# Patient Record
Sex: Female | Born: 1935 | Race: White | Hispanic: No | Smoking: Former smoker
Health system: Southern US, Community
[De-identification: ages and names within clinical notes are randomized; demographics above are authoritative.]

## PROBLEM LIST (undated history)

## (undated) DIAGNOSIS — Z860100 Personal history of colon polyps, unspecified: Secondary | ICD-10-CM

## (undated) DIAGNOSIS — C55 Malignant neoplasm of uterus, part unspecified: Secondary | ICD-10-CM

## (undated) DIAGNOSIS — K222 Esophageal obstruction: Secondary | ICD-10-CM

## (undated) DIAGNOSIS — R350 Frequency of micturition: Secondary | ICD-10-CM

## (undated) DIAGNOSIS — G5601 Carpal tunnel syndrome, right upper limb: Secondary | ICD-10-CM

## (undated) DIAGNOSIS — R9431 Abnormal electrocardiogram [ECG] [EKG]: Secondary | ICD-10-CM

## (undated) DIAGNOSIS — K625 Hemorrhage of anus and rectum: Secondary | ICD-10-CM

## (undated) DIAGNOSIS — I2510 Atherosclerotic heart disease of native coronary artery without angina pectoris: Secondary | ICD-10-CM

## (undated) DIAGNOSIS — E785 Hyperlipidemia, unspecified: Secondary | ICD-10-CM

## (undated) DIAGNOSIS — G479 Sleep disorder, unspecified: Secondary | ICD-10-CM

## (undated) DIAGNOSIS — K219 Gastro-esophageal reflux disease without esophagitis: Secondary | ICD-10-CM

## (undated) DIAGNOSIS — M949 Disorder of cartilage, unspecified: Secondary | ICD-10-CM

## (undated) DIAGNOSIS — M899 Disorder of bone, unspecified: Secondary | ICD-10-CM

## (undated) DIAGNOSIS — M546 Pain in thoracic spine: Secondary | ICD-10-CM

## (undated) DIAGNOSIS — E538 Deficiency of other specified B group vitamins: Secondary | ICD-10-CM

## (undated) DIAGNOSIS — I10 Essential (primary) hypertension: Secondary | ICD-10-CM

## (undated) DIAGNOSIS — I999 Unspecified disorder of circulatory system: Secondary | ICD-10-CM

## (undated) HISTORY — DX: Esophageal obstruction: K22.2

## (undated) HISTORY — DX: Deficiency of other specified B group vitamins: E53.8

## (undated) HISTORY — DX: Personal history of colon polyps, unspecified: Z86.0100

## (undated) HISTORY — PX: ANKLE FRACTURE SURGERY: SHX122

## (undated) HISTORY — DX: Unspecified disorder of circulatory system: I99.9

## (undated) HISTORY — DX: Hemorrhage of anus and rectum: K62.5

## (undated) HISTORY — DX: Abnormal electrocardiogram (ECG) (EKG): R94.31

## (undated) HISTORY — DX: Disorder of cartilage, unspecified: M94.9

## (undated) HISTORY — PX: BREAST EXCISIONAL BIOPSY: SUR124

## (undated) HISTORY — DX: Pain in thoracic spine: M54.6

## (undated) HISTORY — DX: Disorder of bone, unspecified: M89.9

## (undated) HISTORY — DX: Gastro-esophageal reflux disease without esophagitis: K21.9

## (undated) HISTORY — DX: Hyperlipidemia, unspecified: E78.5

## (undated) HISTORY — PX: WRIST FRACTURE SURGERY: SHX121

## (undated) HISTORY — DX: Atherosclerotic heart disease of native coronary artery without angina pectoris: I25.10

## (undated) HISTORY — DX: Carpal tunnel syndrome, right upper limb: G56.01

## (undated) HISTORY — DX: Essential (primary) hypertension: I10

## (undated) HISTORY — DX: Malignant neoplasm of uterus, part unspecified: C55

## (undated) HISTORY — DX: Sleep disorder, unspecified: G47.9

## (undated) HISTORY — PX: CHOLECYSTECTOMY: SHX55

## (undated) HISTORY — PX: COLONOSCOPY W/ BIOPSIES AND POLYPECTOMY: SHX1376

## (undated) HISTORY — DX: Frequency of micturition: R35.0

## (undated) HISTORY — DX: Personal history of colonic polyps: Z86.010

---

## 1999-02-26 ENCOUNTER — Inpatient Hospital Stay (HOSPITAL_COMMUNITY): Admission: EM | Admit: 1999-02-26 | Discharge: 1999-02-28 | Payer: Self-pay | Admitting: Emergency Medicine

## 1999-02-27 ENCOUNTER — Encounter: Payer: Self-pay | Admitting: Emergency Medicine

## 2000-02-13 ENCOUNTER — Other Ambulatory Visit: Admission: RE | Admit: 2000-02-13 | Discharge: 2000-02-13 | Payer: Self-pay | Admitting: Internal Medicine

## 2000-02-15 ENCOUNTER — Encounter: Payer: Self-pay | Admitting: Internal Medicine

## 2000-02-15 ENCOUNTER — Encounter: Admission: RE | Admit: 2000-02-15 | Discharge: 2000-02-15 | Payer: Self-pay | Admitting: Internal Medicine

## 2000-03-06 ENCOUNTER — Encounter: Admission: RE | Admit: 2000-03-06 | Discharge: 2000-03-06 | Payer: Self-pay | Admitting: Internal Medicine

## 2000-03-06 ENCOUNTER — Encounter: Payer: Self-pay | Admitting: Internal Medicine

## 2000-11-26 HISTORY — PX: ESOPHAGEAL DILATION: SHX303

## 2001-06-30 ENCOUNTER — Encounter (INDEPENDENT_AMBULATORY_CARE_PROVIDER_SITE_OTHER): Payer: Self-pay

## 2001-06-30 ENCOUNTER — Other Ambulatory Visit: Admission: RE | Admit: 2001-06-30 | Discharge: 2001-06-30 | Payer: Self-pay | Admitting: Gastroenterology

## 2002-04-29 ENCOUNTER — Encounter: Admission: RE | Admit: 2002-04-29 | Discharge: 2002-04-29 | Payer: Self-pay | Admitting: Internal Medicine

## 2002-04-29 ENCOUNTER — Encounter: Payer: Self-pay | Admitting: Internal Medicine

## 2002-06-12 ENCOUNTER — Encounter: Payer: Self-pay | Admitting: Internal Medicine

## 2002-06-12 ENCOUNTER — Ambulatory Visit (HOSPITAL_COMMUNITY): Admission: RE | Admit: 2002-06-12 | Discharge: 2002-06-12 | Payer: Self-pay | Admitting: Internal Medicine

## 2002-07-02 ENCOUNTER — Other Ambulatory Visit: Admission: RE | Admit: 2002-07-02 | Discharge: 2002-07-02 | Payer: Self-pay | Admitting: Obstetrics and Gynecology

## 2004-11-08 ENCOUNTER — Ambulatory Visit: Payer: Self-pay | Admitting: Internal Medicine

## 2004-11-26 HISTORY — PX: ESOPHAGEAL DILATION: SHX303

## 2004-12-05 ENCOUNTER — Ambulatory Visit: Payer: Self-pay | Admitting: Gastroenterology

## 2004-12-11 ENCOUNTER — Ambulatory Visit: Payer: Self-pay | Admitting: Gastroenterology

## 2004-12-18 ENCOUNTER — Ambulatory Visit: Payer: Self-pay | Admitting: Gastroenterology

## 2004-12-25 ENCOUNTER — Ambulatory Visit: Payer: Self-pay | Admitting: Gastroenterology

## 2005-01-03 ENCOUNTER — Ambulatory Visit: Payer: Self-pay | Admitting: Gastroenterology

## 2006-09-02 ENCOUNTER — Ambulatory Visit: Payer: Self-pay | Admitting: Internal Medicine

## 2006-09-02 ENCOUNTER — Encounter: Payer: Self-pay | Admitting: Cardiovascular Disease

## 2006-09-05 ENCOUNTER — Ambulatory Visit: Payer: Self-pay | Admitting: Internal Medicine

## 2006-09-05 LAB — CONVERTED CEMR LAB
AST: 15 units/L (ref 0–37)
Basophils Absolute: 0 10*3/uL (ref 0.0–0.1)
Eosinophil percent: 3.7 % (ref 0.0–5.0)
HCT: 43.9 % (ref 36.0–46.0)
Lymphocytes Relative: 33.9 % (ref 12.0–46.0)
Monocytes Absolute: 0.6 10*3/uL (ref 0.2–0.7)
Monocytes Relative: 8.3 % (ref 3.0–11.0)
Potassium: 4.6 meq/L (ref 3.5–5.1)
RBC: 4.73 M/uL (ref 3.87–5.11)
RDW: 13.5 % (ref 11.5–14.6)
TSH: 2.62 microintl units/mL (ref 0.35–5.50)
WBC: 6.7 10*3/uL (ref 4.5–10.5)

## 2006-09-20 ENCOUNTER — Ambulatory Visit: Payer: Self-pay | Admitting: Internal Medicine

## 2006-09-27 ENCOUNTER — Encounter: Admission: RE | Admit: 2006-09-27 | Discharge: 2006-09-27 | Payer: Self-pay | Admitting: Internal Medicine

## 2006-10-28 ENCOUNTER — Encounter: Payer: Self-pay | Admitting: Internal Medicine

## 2007-01-03 ENCOUNTER — Ambulatory Visit: Payer: Self-pay | Admitting: Internal Medicine

## 2007-01-03 LAB — CONVERTED CEMR LAB
Cholesterol: 158 mg/dL (ref 0–200)
LDL Cholesterol: 86 mg/dL (ref 0–99)
Triglycerides: 126 mg/dL (ref 0–149)

## 2007-01-08 ENCOUNTER — Ambulatory Visit: Payer: Self-pay | Admitting: Internal Medicine

## 2007-03-26 ENCOUNTER — Ambulatory Visit: Payer: Self-pay | Admitting: Internal Medicine

## 2007-03-27 DIAGNOSIS — E538 Deficiency of other specified B group vitamins: Secondary | ICD-10-CM | POA: Insufficient documentation

## 2007-03-28 ENCOUNTER — Ambulatory Visit: Payer: Self-pay | Admitting: Internal Medicine

## 2007-09-10 ENCOUNTER — Ambulatory Visit: Payer: Self-pay | Admitting: Internal Medicine

## 2007-09-10 LAB — CONVERTED CEMR LAB
BUN: 16 mg/dL (ref 6–23)
Cholesterol: 160 mg/dL (ref 0–200)
Creatinine, Ser: 0.9 mg/dL (ref 0.4–1.2)
HDL: 49.4 mg/dL (ref >39.0)
LDL Cholesterol: 76 mg/dL (ref 0–99)
Total CHOL/HDL Ratio: 3.2
Triglycerides: 174 mg/dL — ABNORMAL HIGH (ref 0–149)
VLDL: 35 mg/dL (ref 0–40)

## 2007-09-18 ENCOUNTER — Ambulatory Visit: Payer: Self-pay | Admitting: Internal Medicine

## 2007-09-18 DIAGNOSIS — Z8679 Personal history of other diseases of the circulatory system: Secondary | ICD-10-CM | POA: Insufficient documentation

## 2007-09-18 LAB — CONVERTED CEMR LAB: Cholesterol, target level: 200 mg/dL

## 2007-09-29 ENCOUNTER — Encounter: Admission: RE | Admit: 2007-09-29 | Discharge: 2007-09-29 | Payer: Self-pay | Admitting: Internal Medicine

## 2007-09-30 ENCOUNTER — Encounter (INDEPENDENT_AMBULATORY_CARE_PROVIDER_SITE_OTHER): Payer: Self-pay | Admitting: *Deleted

## 2007-09-30 ENCOUNTER — Telehealth (INDEPENDENT_AMBULATORY_CARE_PROVIDER_SITE_OTHER): Payer: Self-pay | Admitting: *Deleted

## 2007-10-03 ENCOUNTER — Encounter (INDEPENDENT_AMBULATORY_CARE_PROVIDER_SITE_OTHER): Payer: Self-pay | Admitting: *Deleted

## 2007-10-03 ENCOUNTER — Ambulatory Visit: Payer: Self-pay | Admitting: Internal Medicine

## 2007-10-08 ENCOUNTER — Encounter: Admission: RE | Admit: 2007-10-08 | Discharge: 2007-10-08 | Payer: Self-pay | Admitting: Internal Medicine

## 2007-10-08 ENCOUNTER — Encounter: Payer: Self-pay | Admitting: Internal Medicine

## 2007-10-10 ENCOUNTER — Encounter (INDEPENDENT_AMBULATORY_CARE_PROVIDER_SITE_OTHER): Payer: Self-pay | Admitting: *Deleted

## 2007-10-30 ENCOUNTER — Ambulatory Visit: Payer: Self-pay | Admitting: Internal Medicine

## 2007-10-30 DIAGNOSIS — K222 Esophageal obstruction: Secondary | ICD-10-CM | POA: Insufficient documentation

## 2008-10-20 ENCOUNTER — Telehealth (INDEPENDENT_AMBULATORY_CARE_PROVIDER_SITE_OTHER): Payer: Self-pay | Admitting: *Deleted

## 2008-12-20 ENCOUNTER — Telehealth (INDEPENDENT_AMBULATORY_CARE_PROVIDER_SITE_OTHER): Payer: Self-pay | Admitting: *Deleted

## 2008-12-28 ENCOUNTER — Ambulatory Visit: Payer: Self-pay | Admitting: Internal Medicine

## 2008-12-30 ENCOUNTER — Ambulatory Visit: Payer: Self-pay | Admitting: Internal Medicine

## 2008-12-30 DIAGNOSIS — M899 Disorder of bone, unspecified: Secondary | ICD-10-CM | POA: Insufficient documentation

## 2008-12-30 DIAGNOSIS — K625 Hemorrhage of anus and rectum: Secondary | ICD-10-CM | POA: Insufficient documentation

## 2008-12-30 DIAGNOSIS — G479 Sleep disorder, unspecified: Secondary | ICD-10-CM | POA: Insufficient documentation

## 2008-12-30 DIAGNOSIS — M949 Disorder of cartilage, unspecified: Secondary | ICD-10-CM

## 2008-12-30 DIAGNOSIS — Z860100 Personal history of colon polyps, unspecified: Secondary | ICD-10-CM | POA: Insufficient documentation

## 2008-12-31 ENCOUNTER — Ambulatory Visit: Payer: Self-pay | Admitting: Internal Medicine

## 2008-12-31 ENCOUNTER — Encounter: Payer: Self-pay | Admitting: Cardiovascular Disease

## 2008-12-31 DIAGNOSIS — D510 Vitamin B12 deficiency anemia due to intrinsic factor deficiency: Secondary | ICD-10-CM | POA: Insufficient documentation

## 2008-12-31 DIAGNOSIS — T887XXA Unspecified adverse effect of drug or medicament, initial encounter: Secondary | ICD-10-CM | POA: Insufficient documentation

## 2009-01-03 ENCOUNTER — Encounter (INDEPENDENT_AMBULATORY_CARE_PROVIDER_SITE_OTHER): Payer: Self-pay | Admitting: *Deleted

## 2009-01-03 LAB — CONVERTED CEMR LAB
Albumin: 3.7 g/dL (ref 3.5–5.2)
Alkaline Phosphatase: 71 units/L (ref 39–117)
Basophils Relative: 0.7 % (ref 0.0–3.0)
Cholesterol: 161 mg/dL (ref 0–200)
LDL Cholesterol: 95 mg/dL (ref 0–99)
MCHC: 34.1 g/dL (ref 30.0–36.0)
MCV: 91.5 fL (ref 78.0–100.0)
Monocytes Absolute: 0.5 10*3/uL (ref 0.1–1.0)
Platelets: 204 10*3/uL (ref 150–400)
Potassium: 4 meq/L (ref 3.5–5.1)
RBC: 4.59 M/uL (ref 3.87–5.11)
TSH: 3.42 microintl units/mL (ref 0.35–5.50)
Total Bilirubin: 0.9 mg/dL (ref 0.3–1.2)
Total Protein: 6.9 g/dL (ref 6.0–8.3)
VLDL: 22 mg/dL (ref 0–40)

## 2009-01-07 ENCOUNTER — Ambulatory Visit: Payer: Self-pay | Admitting: Gastroenterology

## 2009-01-10 ENCOUNTER — Telehealth: Payer: Self-pay | Admitting: Gastroenterology

## 2009-01-12 ENCOUNTER — Ambulatory Visit: Payer: Self-pay | Admitting: Gastroenterology

## 2009-05-11 ENCOUNTER — Encounter: Admission: RE | Admit: 2009-05-11 | Discharge: 2009-05-11 | Payer: Self-pay | Admitting: Internal Medicine

## 2009-06-02 ENCOUNTER — Telehealth (INDEPENDENT_AMBULATORY_CARE_PROVIDER_SITE_OTHER): Payer: Self-pay | Admitting: *Deleted

## 2009-10-07 ENCOUNTER — Ambulatory Visit: Payer: Self-pay | Admitting: Internal Medicine

## 2009-10-10 ENCOUNTER — Emergency Department (HOSPITAL_COMMUNITY): Admission: EM | Admit: 2009-10-10 | Discharge: 2009-10-10 | Payer: Self-pay | Admitting: Emergency Medicine

## 2009-11-26 HISTORY — PX: TOTAL ABDOMINAL HYSTERECTOMY W/ BILATERAL SALPINGOOPHORECTOMY: SHX83

## 2009-12-28 LAB — CONVERTED CEMR LAB: Vitamin B-12: 309 pg/mL (ref 211–911)

## 2009-12-30 ENCOUNTER — Ambulatory Visit: Payer: Self-pay | Admitting: Internal Medicine

## 2009-12-30 DIAGNOSIS — K219 Gastro-esophageal reflux disease without esophagitis: Secondary | ICD-10-CM | POA: Insufficient documentation

## 2010-01-18 ENCOUNTER — Ambulatory Visit: Payer: Self-pay | Admitting: Internal Medicine

## 2010-01-18 DIAGNOSIS — G5600 Carpal tunnel syndrome, unspecified upper limb: Secondary | ICD-10-CM | POA: Insufficient documentation

## 2010-05-31 ENCOUNTER — Encounter: Admission: RE | Admit: 2010-05-31 | Discharge: 2010-05-31 | Payer: Self-pay | Admitting: Internal Medicine

## 2010-06-26 ENCOUNTER — Ambulatory Visit: Payer: Self-pay | Admitting: Internal Medicine

## 2010-06-26 DIAGNOSIS — C55 Malignant neoplasm of uterus, part unspecified: Secondary | ICD-10-CM | POA: Insufficient documentation

## 2010-06-30 ENCOUNTER — Encounter: Payer: Self-pay | Admitting: Internal Medicine

## 2010-07-05 ENCOUNTER — Encounter: Payer: Self-pay | Admitting: Internal Medicine

## 2010-07-18 ENCOUNTER — Encounter: Payer: Self-pay | Admitting: Internal Medicine

## 2010-08-23 ENCOUNTER — Encounter: Payer: Self-pay | Admitting: Internal Medicine

## 2010-09-14 ENCOUNTER — Encounter: Payer: Self-pay | Admitting: Internal Medicine

## 2010-09-22 ENCOUNTER — Encounter: Payer: Self-pay | Admitting: Internal Medicine

## 2010-11-03 ENCOUNTER — Ambulatory Visit: Payer: Self-pay | Admitting: Internal Medicine

## 2010-11-03 ENCOUNTER — Telehealth (INDEPENDENT_AMBULATORY_CARE_PROVIDER_SITE_OTHER): Payer: Self-pay | Admitting: *Deleted

## 2010-11-03 DIAGNOSIS — R350 Frequency of micturition: Secondary | ICD-10-CM | POA: Insufficient documentation

## 2010-11-03 DIAGNOSIS — M546 Pain in thoracic spine: Secondary | ICD-10-CM | POA: Insufficient documentation

## 2010-11-03 LAB — CONVERTED CEMR LAB
Bilirubin Urine: NEGATIVE
Nitrite: NEGATIVE
Urobilinogen, UA: NEGATIVE
pH: 5

## 2010-11-04 ENCOUNTER — Encounter: Payer: Self-pay | Admitting: Internal Medicine

## 2010-11-06 ENCOUNTER — Ambulatory Visit: Payer: Self-pay | Admitting: Internal Medicine

## 2010-11-07 ENCOUNTER — Encounter: Payer: Self-pay | Admitting: Internal Medicine

## 2010-12-26 NOTE — Letter (Signed)
Summary: [facility]   Imported By: Lanelle Bal 07/26/2010 12:03:12  _____________________________________________________________________  External Attachment:    Type:   Image     Comment:   External Document

## 2010-12-26 NOTE — Assessment & Plan Note (Signed)
Summary: coughing, wants her throat looked at - jr   Vital Signs:  Patient profile:   77 year old female Weight:      150.4 pounds Temp:     98.5 degrees F oral Pulse rate:   60 / minute Resp:     17 per minute BP sitting:   138 / 84  (left arm) Cuff size:   large  Vitals Entered By: Shonna Chock (December 30, 2009 11:01 AM) CC: Cough off/on since November 2010 Comments REVIEWED MED LIST, PATIENT AGREED DOSE AND INSTRUCTION CORRECT  Flu Vaccine Consent Questions     Do you have a history of severe allergic reactions to this vaccine? no    Any prior history of allergic reactions to egg and/or gelatin? no    Do you have a sensitivity to the preservative Thimersol? no    Do you have a past history of Guillan-Barre Syndrome? no    Do you currently have an acute febrile illness? no    Have you ever had a severe reaction to latex? no    Vaccine information given and explained to patient? yes    Are you currently pregnant? no    Lot Number:[id number]   Exp Date:05/25/2010   Site Given  Right Deltoid IM   Primary Care Provider:  Marga Melnick, MD  CC:  Cough off/on since November 2010.  History of Present Illness:  Cough      This is an 77 year old woman who presents with Cough intermittently since 09/2009.  She was seen in ER in 10/2009 for more severe cough with SOB.No PMH of asthma , HHN treatments helped. HHN @ home helps. The patient reports non-productive cough, but denies pleuritic chest pain, shortness of breath, wheezing, exertional dyspnea, fever, hemoptysis, and malaise.  The patient denies the following symptoms: cold/URI symptoms, sore throat, nasal congestion, chronic rhinitis, weight loss, acid reflux symptoms, and peripheral edema.  The cough is worse with cold exposure.  Risk factors include history of reflux.  Diagnostic testing to date has included CXR.  She is on ACE-I. She smoked  until 40 yrs ago. She is worried about throat cancer.   Allergies: 1)  !  Codeine  Past History:  Past Medical History: Hyperlipidemia, LDL goal = < 100 Hypertension Transient ischemic attack, hx of I995 Low B12 Osteopenia (T score -1.8 @ femoral neck in 2007) Cerebrovascular accident, hx of 1999 Transient ischemic attack, hx of 2002 Colonic polyps, hx of 2006, Dr Jarold Motto GERD  Review of Systems General:  Denies chills, fever, sweats, and weight loss. ENT:  Complains of hoarseness; denies difficulty swallowing, nasal congestion, sinus pressure, and sore throat. Resp:  Denies chest pain with inspiration, coughing up blood, sputum productive, and wheezing. GI:  Denies abdominal pain, bloody stools, dark tarry stools, and indigestion. Allergy:  Denies itching eyes and sneezing.  Physical Exam  General:  Appears younger than age,well-nourished,in no acute distress; alert,appropriate and cooperative throughout examination Ears:  External ear exam shows no significant lesions or deformities.  Otoscopic examination reveals clear canals, tympanic membranes are intact bilaterally without bulging, retraction, inflammation or discharge. Hearing is grossly normal bilaterally.Some wax bilaterally Nose:  External nasal examination shows no deformity or inflammation. Nasal mucosa are pink and moist without lesions or exudates. Mouth:  Oral mucosa and oropharynx without lesions or exudates.  Teeth in good repair. Slightly hoarse Lungs:  Normal respiratory effort, chest expands symmetrically. Lungs are clear to auscultation, no crackles or wheezes. Heart:  regular rhythm, no murmur, no gallop, no rub, no JVD, and bradycardia.   Abdomen:  Bowel sounds positive,abdomen soft and non-tender without masses, organomegaly or hernias noted. Skin:  Intact without suspicious lesions or rashes Cervical Nodes:  No lymphadenopathy noted Axillary Nodes:  No palpable lymphadenopathy   Impression & Recommendations:  Problem # 1:  COUGH (ICD-786.2) probably from ACE-I  Problem #  2:  HYPERTENSION, ESSENTIAL NOS (ICD-401.9)  The following medications were removed from the medication list:    Benazepril Hcl 40 Mg Tabs (Benazepril hcl) .Marland Kitchen... Take 1/2 tablet by mouth once daily Her updated medication list for this problem includes:    Amlodipine Besylate 5 Mg Tabs (Amlodipine besylate) .Marland Kitchen... 1 once daily  Problem # 3:  GERD (ICD-530.81) PMH of  Complete Medication List: 1)  Pravastatin Sodium 20 Mg Tabs (Pravastatin sodium) .Marland Kitchen.. 1 by mouth qd 2)  Plavix 75 Mg Tabs (Clopidogrel bisulfate) .Marland Kitchen.. 1 by mouth qd 3)  Multivitamin  .... Once daily 4)  Calcium 600 600 Mg Tabs (Calcium carbonate) .... Take 1 tablet by mouth two times a day 5)  Vitamin D3 400 Unit Tabs (Cholecalciferol) .... Take 2 tablets by mouth once daily 6)  Tylenol Extra Strength 500 Mg Tabs (Acetaminophen) .Marland Kitchen.. 1 by mouth at bedtime 7)  Biotin 1000 Mcg Tabs (Biotin) .Marland Kitchen.. 1 by mouth once daily 8)  Aspirin Adult Low Strength 81 Mg Tbec (Aspirin) .Marland Kitchen.. 1 by mouth once daily 9)  Stool Softner(next To Ashby Dawes)  .Marland Kitchen.. 1 by mouth once daily 10)  Amlodipine Besylate 5 Mg Tabs (Amlodipine besylate) .Marland Kitchen.. 1 once daily  Other Orders: Flu Vaccine 40yrs + (86578) Administration Flu vaccine - MCR (I6962)  Patient Instructions: 1)  Qvar 2 puffs every 12 hrs ; gargle & spit after use. Prilosec OTC 30 minutes before b'fast.  2)  Avoid foods high in acid (tomatoes, citrus juices, spicy foods). Avoid eating within two hours of lying down or before exercising. Do not over eat; try smaller more frequent meals. Elevate head of bed twelve inches when sleeping. 3)  Please schedule a follow-up appointment in 2& 1/2  weeks. Prescriptions: AMLODIPINE BESYLATE 5 MG TABS (AMLODIPINE BESYLATE) 1 once daily  #30 x 5   Entered and Authorized by:   Marga Melnick MD   Signed by:   Marga Melnick MD on 12/30/2009   Method used:   Faxed to ...       [facility]  [address] [phone]* (retail)       [address]       [place], [place]  [id number]       Ph: [phone] or [phone]       Fax: [phone]   RxID:   [id number]

## 2010-12-26 NOTE — Assessment & Plan Note (Signed)
Summary: 2 1/2 week roa//lh   Vital Signs:  Patient profile:   77 year old female Weight:      154.4 pounds Temp:     98.4 degrees F oral Pulse rate:   76 / minute Resp:     15 per minute BP sitting:   126 / 72  (left arm) Cuff size:   large  Vitals Entered By: Shonna Chock (January 18, 2010 10:33 AM) CC: Follow-up visit: patient is better since last OV, patient would like to make sure throat is ok, Hypertension Management Comments REVIEWED MED LIST, PATIENT AGREED DOSE AND INSTRUCTION CORRECT    Primary Care Provider:  Marga Melnick, MD  CC:  Follow-up visit: patient is better since last OV, patient would like to make sure throat is ok, and Hypertension Management.  History of Present Illness: She feels better on Amlodipine; NP cough is improved dramatically. Concerned about cough in context of remote smoking  Hypertension History:      She complains of neurologic problems, but denies headache, chest pain, palpitations, dyspnea with exertion, orthopnea, PND, peripheral edema, visual symptoms, syncope, and side effects from treatment.  She notes no problems with any antihypertensive medication side effects.  Further comments include: No BP checks @ home .        Positive major cardiovascular risk factors include female age 72 years old or older, hyperlipidemia, hypertension, and family history for ischemic heart disease (males less than 61 years old).  Negative major cardiovascular risk factors include no history of diabetes and non-tobacco-user status.        Positive history for target organ damage include prior stroke (or TIA).  Further assessment for target organ damage reveals no history of ASHD or peripheral vascular disease.     Allergies: 1)  ! Codeine  Review of Systems Eyes:  Denies blurring, double vision, and vision loss-both eyes. ENT:  Denies hoarseness, nasal congestion, postnasal drainage, and sinus pressure; No purulence. Rare dysphagia when hurrying   meals. Resp:  Denies shortness of breath, sputum productive, and wheezing. Neuro:  Complains of numbness and tingling; Positional N&T RUE @ night & driving.  Physical Exam  General:  in no acute distress; alert,appropriate and cooperative throughout examination Neck:  No deformities, masses, or tenderness noted. Lungs:  Normal respiratory effort, chest expands symmetrically. Lungs are clear to auscultation, no crackles or wheezes. Heart:  Normal rate and regular rhythm. S1 and S2 normal without gallop, murmur, click, rub. S4 Pulses:  R and L carotid,radial  pulses are full and equal bilaterally Extremities:  No clubbing, cyanosis, edema. Neurologic:  alert & oriented X3.   + Tinel's RUE Skin:  Intact without suspicious lesions or rashes Cervical Nodes:  No lymphadenopathy noted Axillary Nodes:  No palpable lymphadenopathy   Impression & Recommendations:  Problem # 1:  HYPERTENSION, ESSENTIAL NOS (ICD-401.9)  controlled Her updated medication list for this problem includes:    Amlodipine Besylate 5 Mg Tabs (Amlodipine besylate) .Marland Kitchen... 1 once daily  Orders: Prescription Created Electronically [phone])  Problem # 2:  COUGH (ICD-786.2)  improved off ACE-I  Orders: T-2 View CXR (71020TC)  Problem # 3:  CARPAL TUNNEL SYNDROME, RIGHT (ICD-354.0)  Orders: Venipuncture (60454) TLB-TSH (Thyroid Stimulating Hormone) (84443-TSH) TLB-T4 (Thyrox), Free [phone])  Complete Medication List: 1)  Pravastatin Sodium 20 Mg Tabs (Pravastatin sodium) .Marland Kitchen.. 1 by mouth qd 2)  Plavix 75 Mg Tabs (Clopidogrel bisulfate) .Marland Kitchen.. 1 by mouth qd 3)  Multivitamin  .... Once daily 4)  Calcium 600  600 Mg Tabs (Calcium carbonate) .... Take 1 tablet by mouth two times a day 5)  Vitamin D3 400 Unit Tabs (Cholecalciferol) .... Take 2 tablets by mouth once daily 6)  Tylenol Extra Strength 500 Mg Tabs (Acetaminophen) .Marland Kitchen.. 1 by mouth at bedtime 7)  Biotin 1000 Mcg Tabs (Biotin) .Marland Kitchen.. 1 by mouth once daily 8)   Aspirin Adult Low Strength 81 Mg Tbec (Aspirin) .Marland Kitchen.. 1 by mouth once daily 9)  Stool Softner(next To Ashby Dawes)  .Marland Kitchen.. 1 by mouth once daily 10)  Amlodipine Besylate 5 Mg Tabs (Amlodipine besylate) .Marland Kitchen.. 1 once daily  Hypertension Assessment/Plan:      The patient's hypertensive risk group is category C: Target organ damage and/or diabetes.  Her calculated 10 year risk of coronary heart disease is 9 %.  Today's blood pressure is 126/72.    Patient Instructions: 1)  Wrist Splint @ night  for Carpal Tunnel Syndrome if thyroid tests are normal. Prescriptions: AMLODIPINE BESYLATE 5 MG TABS (AMLODIPINE BESYLATE) 1 once daily  #90 x 3   Entered and Authorized by:   Marga Melnick MD   Signed by:   Marga Melnick MD on 01/18/2010   Method used:   [facility]ed to ...       [facility]  [address] [phone] (retail)       [address]       [place], [place]  [id number]       Ph: [phone] or [phone]       Fax: [phone]   RxID:   [id number]

## 2010-12-26 NOTE — Letter (Signed)
Summary: [facility]  [facility]   Imported By: Lanelle Bal 07/26/2010 12:49:24  _____________________________________________________________________  External Attachment:    Type:   Image     Comment:   External Document

## 2010-12-26 NOTE — Letter (Signed)
Summary: [facility]  [facility]   Imported By: Lanelle Bal 07/17/2010 09:38:02  _____________________________________________________________________  External Attachment:    Type:   Image     Comment:   External Document

## 2010-12-26 NOTE — Letter (Signed)
Summary: [facility]   Imported By: Lanelle Bal 09/13/2010 16:11:56  _____________________________________________________________________  External Attachment:    Type:   Image     Comment:   External Document

## 2010-12-26 NOTE — Letter (Signed)
Summary: [facility] Radiation [facility]  [facility]iation [facility]   Imported By: Lanelle Bal 10/10/2010 08:36:53  _____________________________________________________________________  External Attachment:    Type:   Image     Comment:   External Document

## 2010-12-26 NOTE — Letter (Signed)
Summary: [facility] Radiation Oncology  [facility]   Imported By: Lanelle Bal 10/10/2010 08:38:02  _____________________________________________________________________  External Attachment:    Type:   Image     Comment:   External Document

## 2010-12-26 NOTE — Assessment & Plan Note (Signed)
Summary: back problems/cystitis/kn   Vital Signs:  Patient profile:   77 year old female Height:      59.75 inches (151.77 cm) Weight:      149.25 pounds (67.84 kg) BMI:     29.50 Temp:     98.4 degrees F (36.89 degrees C) oral Resp:     16 per minute BP sitting:   120 / 80  (left arm) Cuff size:   large  Vitals Entered By: Lucious Groves CMA (November 03, 2010 3:47 PM) CC: C/O back problems./kb, Dysuria, Back pain Is Patient Diabetic? No Pain Assessment Patient in pain? yes     Location: back Intensity: 4 Type: dull ache Comments Patient notes that she has back problems and thinks she may have cystitis. She notes increased urination frequency and urgency. She denies fever, and dysuria.   Primary Care Provider:  Marga Melnick, MD  CC:  C/O back problems./kb, Dysuria, and Back pain.  History of Present Illness:      This is an 77 year old woman who presents with  Back Pain for 5-7 days. The patient  also reports urinary frequency, but denies burning with urination, urgency, hematuria, and vaginal discharge.   The patient denies the following associated symptoms: nausea, vomiting, fever, shaking chills, flank pain, abdominal pain, and pelvic pain. The pain is located in the mid thoracic back "@ the bra line".  The pain began suddenly w/o trigger.  The pain is made worse by flexion.  The pain is made better by acetaminophen.  S/P TAH  & BSO in 08/11 followed by Radiation which was completed 3 weeks ago.PMH of Osteopenia ; T score -2.3 @ R femoral neck 02/10..  Current Medications (verified): 1)  Pravastatin Sodium 20 Mg Tabs (Pravastatin Sodium) .Marland Kitchen.. 1 At Bedtime **appointment Due** 2)  Plavix 75 Mg  Tabs (Clopidogrel Bisulfate) .Marland Kitchen.. 1 By Mouth Qd 3)  Multivitamin .... Once Daily 4)  Calcium 600 600 Mg Tabs (Calcium Carbonate) .... Take 1 Tablet By Mouth Two Times A Day 5)  Vitamin D3 400 Unit Tabs (Cholecalciferol) .... Take 2 Tablets By Mouth Once Daily 6)  Tylenol Extra Strength  500 Mg Tabs (Acetaminophen) .Marland Kitchen.. 1 By Mouth At Bedtime 7)  Aspirin Adult Low Strength 81 Mg Tbec (Aspirin) .Marland Kitchen.. 1 By Mouth Once Daily 8)  Stool Softner(Next To Ashby Dawes) .Marland Kitchen.. 1 By Mouth Once Daily 9)  Amlodipine Besylate 5 Mg Tabs (Amlodipine Besylate) .Marland Kitchen.. 1 Once Daily  Allergies (verified): 1)  ! Codeine  Physical Exam  General:  well-nourished,in no acute distress; alert,appropriate and cooperative throughout examination Chest Wall:  no tenderness.   Upper thoracic  kyphosis Abdomen:  Bowel sounds positive,abdomen soft and non-tender without masses, organomegaly or hernias noted. Msk:  No pain to percussion ; she lay back & sat w/o help Extremities:  No clubbing, cyanosis, edema. Minor OA hand changes. Neg SLR  Neurologic:  alert & oriented X3, strength normal in all extremities, gait normal, and DTRs symmetrical  but 0-1/2+ Skin:  Intact without suspicious lesions or rashes. Lipoma R upper back  Cervical Nodes:  No lymphadenopathy noted Axillary Nodes:  No palpable lymphadenopathy Psych:  memory intact for recent and remote, normally interactive, and good eye contact.     Impression & Recommendations:  Problem # 1:  BACK PAIN, THORACIC REGION (ICD-724.1)  Her updated medication list for this problem includes:    Tylenol Extra Strength 500 Mg Tabs (Acetaminophen) .Marland Kitchen... 1 by mouth at bedtime    Aspirin  Adult Low Strength 81 Mg Tbec (Aspirin) .Marland Kitchen... 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 1 at bedtime **appointment due** 2)  Plavix 75 Mg Tabs (Clopidogrel bisulfate) .Marland Kitchen.. 1 by mouth qd 3)  Multivitamin  .... Once daily 4)   Calcium 600 600 Mg Tabs (Calcium carbonate) .... Take 1 tablet by mouth two times a day 5)  Vitamin D3 400 Unit Tabs (Cholecalciferol) .... Take 2 tablets by mouth once daily 6)  Tylenol Extra Strength 500 Mg Tabs (Acetaminophen) .Marland Kitchen.. 1 by mouth at bedtime 7)  Aspirin Adult Low Strength 81 Mg Tbec (Aspirin) .Marland Kitchen.. 1 by mouth once daily 8)  Stool Softner(next To Ashby Dawes)  .Marland Kitchen.. 1 by mouth once daily 9)  Amlodipine Besylate 5 Mg Tabs (Amlodipine besylate) .Marland Kitchen.. 1 once daily 10)  Mobic 7.5  .Marland Kitchen.. 1 two times a day as needed for back pain  Other Orders: Specimen Handling (96295) UA Dipstick w/o Micro (manual) (28413)  Patient Instructions: 1)  Drink as much fluid as you can tolerate for the next few days. Prescriptions: MOBIC 7.5 1 two times a day as needed for back pain  #20 x 0   Entered and Authorized by:   Marga Melnick MD   Signed by:   Marga Melnick MD on 11/03/2010   Method used:   Print then Give to Patient   RxID:   [id number]    Orders Added: 1)  Est. Patient Level IV [34742] 2)  T-Thoracic Spine 2 Views [72070TC] 3)  UA Dipstick W/ Micro (manual) [81000] 4)  T-Culture, Urine [[id number]] 5)  Specimen Handling [99000] 6)  T-Culture, Urine [[id number]] 7)  UA Dipstick w/o Micro (manual) [81002]    Laboratory Results   Urine Tests   Date/Time Reported: November 03, 2010 4:47 PM   Routine Urinalysis   Color: lt. yellow Appearance: Cloudy Glucose: negative   (Normal Range: Negative) Bilirubin: negative   (Normal Range: Negative) Ketone: negative   (Normal Range: Negative) Spec. Gravity: <1.005   (Normal Range: 1.003-1.035) Blood: large   (Normal Range: Negative) pH: 5.0   (Normal Range: 5.0-8.0) Protein: trace   (Normal Range: Negative) Urobilinogen: negative   (Normal Range: 0-1) Nitrite: negative   (Normal Range: Negative) Leukocyte Esterace: large   (Normal Range: Negative)    Comments: Floydene Flock  November 03, 2010 4:48 PM cx sent

## 2010-12-26 NOTE — Assessment & Plan Note (Signed)
Summary: CLEARANCE FOR SURGERY/CBS   Vital Signs:  Patient profile:   77 year old female Height:      59.75 inches Weight:      153.8 pounds BMI:     30.40 Temp:     98.1 degrees F oral Pulse rate:   64 / minute Resp:     14 per minute BP sitting:   128 / 80  (left arm) Cuff size:   large  Vitals Entered By: Shonna Chock CMA (June 26, 2010 1:16 PM) CC: Surgical clearance-Hysterectomy, patient had EKG last week at [facility] , Pre-op Evaluation   Primary Care Provider:  Marga Melnick, MD  CC:  Surgical clearance-Hysterectomy, patient had EKG last week at [facility] , and Pre-op Evaluation.  History of Present Illness: A TAH & BSO are planned   06/29/2010 by Dr Ruthe Mannan @ [facility] for uterine cancer diagnosed on biopsy by Dr Edward Jolly. Presentation was spotting for a month.Pre op clearance has been requested.   The patient denies respiratory symptoms, GI symptoms, chest pain, edema, or  PND.  Positive PMH placing the patient at moderate risk for surgery includes advanced age.  Patient has no history of mild angina, previous MI, compensated CHF, diabetes, renal insufficiency,  or abnormal ECG( an EKG was performed last week @ [facility]).  Conditions requiring action prior to surgery include antiplatelet agents. She has been on Plavix since a TIA in 1995. She denies PMH of CVA.  Allergies: 1)  ! Codeine  Past History:  Past Medical History: Hyperlipidemia, LDL goal = < 100 Hypertension Transient ischemic attack,PMH  of I995 Low B12 Osteopenia (T score -1.8 @ femoral neck in 2007) Colonic polyps, hx of 2006, Dr Jarold Motto GERD  Past Surgical History: Cholecystectomy Lumpectomy for benign lesion Esophageal dilation   2006 ; G 3 P3 Colon polypectomy 2002,2006; Endoscopy :  esophageal stricture ,HH 2002 Right ankle fracture  repair Left arm fracture  repair  Family History: Father: CVA @ age  65 Mother: breast cancer Siblings: bro: DM, MI @ 82; bro: died lung cancer ; bro: ?  liver cancer; son: colon cancer  Social History: no diet Retired Former Smoker : quit  1974 Alcohol use-no Regular exercise-no  Review of Systems General:  Denies chills, fever, sweats, and weight loss. ENT:  Denies difficulty swallowing and hoarseness. CV:  Denies chest pain or discomfort, leg cramps with exertion, palpitations, and shortness of breath with exertion. Resp:  Denies cough, shortness of breath, and sputum productive. GI:  Denies abdominal pain, bloody stools, dark tarry stools, and indigestion. GU:  Denies discharge, dysuria, and hematuria. Neuro:  Complains of numbness and tingling; denies difficulty with concentration, disturbances in coordination, falling down, poor balance, and weakness; N&T in RUE occasionally during sleep. Endo:  Denies cold intolerance, excessive hunger, excessive thirst, excessive urination, and heat intolerance. Heme:  Denies abnormal bruising and bleeding.  Physical Exam  General:  Appears younger than age,well-nourished,in no acute distress; alert,appropriate and cooperative throughout examination Head:  Normocephalic and atraumatic without obvious abnormalities. Eyes:  No corneal or conjunctival inflammation noted. EOMI. Perrla. Field of  Vision grossly normal. Neck:  No deformities, masses, or tenderness noted. Lungs:  Normal respiratory effort, chest expands symmetrically. Lungs are clear to auscultation, no crackles or wheezes. Heart:  Normal rate and regular rhythm. S1 and S2 normal without gallop, murmur, click, rub . S4 Abdomen:  Bowel sounds positive,abdomen soft and non-tender without masses, organomegaly or hernias noted. RUQ op scar Pulses:  R  and L carotid,radial,dorsalis pedis and posterior tibial pulses are full and equal bilaterally Extremities:  No clubbing, cyanosis, edema. Normal full range of motion of all joints.   OA finger changes Neurologic:  alert & oriented X3, cranial nerves II-XII intact, strength normal in all  extremities, sensation intact to light touch, gait normal, and DTRs symmetrical and normal.   Skin:  Intact without suspicious lesions or rashes Cervical Nodes:  No lymphadenopathy noted Axillary Nodes:  No palpable lymphadenopathy Psych:  memory intact for recent and remote, normally interactive, good eye contact, not anxious appearing, and not depressed appearing.     Impression & Recommendations:  Problem # 1:  MALIGNANT NEOPLASM OF UTERUS PART UNSPECIFIED (ICD-179) cleared for surgery  Problem # 2:  HYPERTENSION, ESSENTIAL NOS (ICD-401.9) controlled Her updated medication list for this problem includes:    Amlodipine Besylate 5 Mg Tabs (Amlodipine besylate) .Marland Kitchen... 1 once daily  Problem # 3:  HYPERLIPIDEMIA (ICD-272.2) labs ( lipids & hepatic panel overdue) Her updated medication list for this problem includes:    Pravastatin Sodium 20 Mg Tabs (Pravastatin sodium) .Marland Kitchen... 1 at bedtime  Problem # 4:  TRANSIENT ISCHEMIC ATTACK, HX OF (ICD-V12.50) Plavix can be held perioperatively  Complete Medication List: 1)  Pravastatin Sodium 20 Mg Tabs (Pravastatin sodium) .Marland Kitchen.. 1 at bedtime 2)  Plavix 75 Mg Tabs (Clopidogrel bisulfate) .Marland Kitchen.. 1 by mouth qd 3)  Multivitamin  .... Once daily 4)  Calcium 600 600 Mg Tabs (Calcium carbonate) .... Take 1 tablet by mouth two times a day 5)  Vitamin D3 400 Unit Tabs (Cholecalciferol) .... Take 2 tablets by mouth once daily 6)  Tylenol Extra Strength 500 Mg Tabs (Acetaminophen) .Marland Kitchen.. 1 by mouth at bedtime 7)  Biotin 1000 Mcg Tabs (Biotin) .Marland Kitchen.. 1 by mouth once daily 8)  Aspirin Adult Low Strength 81 Mg Tbec (Aspirin) .Marland Kitchen.. 1 by mouth once daily 9)  Stool Softner(next To Ashby Dawes)  .Marland Kitchen.. 1 by mouth once daily 10)  Amlodipine Besylate 5 Mg Tabs (Amlodipine besylate) .Marland Kitchen.. 1 once daily  Other Orders: Tdap => 29yrs IM (21308) Admin 1st Vaccine (65784)  Patient Instructions: 1)  You are cleared for surgery ; you are overdue for fasting labs( Codes: 272.4,  995.20, 401.9) if not done @ [facility]:  2)  BMP ; 3)  Hepatic Panel ; 4)  Lipid Panel . Prescriptions: PRAVASTATIN SODIUM 20 MG TABS (PRAVASTATIN SODIUM) 1 at bedtime  #90 x 0   Entered and Authorized by:   Marga Melnick MD   Signed by:   Marga Melnick MD on 06/26/2010   Method used:   Faxed to ...       [facility]       [address]       [place], [place]  [address]       Ph: [phone]       Fax: [phone]   RxID:   [id number]    Immunizations Administered:  Tetanus Vaccine:    Vaccine Type: Tdap    Site: right deltoid    Mfr: GlaxoSmithKline    Dose: 0.5 ml    Route: IM    Given by: Shonna Chock CMA    Exp. Date: 02/18/2012    Lot #: [id number]    VIS given: 10/14/07 version given June 26, 2010.

## 2010-12-28 NOTE — Letter (Signed)
Summary: [facility]  [facility]   Imported By: Lanelle Bal 11/22/2010 07:44:59  _____________________________________________________________________  External Attachment:    Type:   Image     Comment:   External Document

## 2010-12-28 NOTE — Progress Notes (Signed)
Summary: 12-9---med request, decline appt  Phone Note Call from Patient   Caller: Patient Summary of Call: Pt left VM that she has cystitis and would like a Rx sent in to pharmacy. Called pt back advise pt that urine sample would be needed to confirm Dx and for med to be Rx. Offer pt appt to come in pt decline stating that she is unable to come in today offer pt appt for Saturday clinic pt states that she will have to see and call back. Pt states that she might just have to wait for monday until her scheduled hysterectomy at [facility] and see if they cant help her there...............Marland KitchenFelecia Deloach CMA  November 03, 2010 12:02 PM   Follow-up for Phone Call        She can go to [facility] lab for CCU for UA & C&S . This is very important  exactly because of surgery on Momday . Once UA reviewed med can be FAXed Follow-up by: Marga Melnick MD,  November 03, 2010 1:08 PM  Additional Follow-up for Phone Call Additional follow up Details #1::        left message to call office..........Marland KitchenFelecia Deloach CMA  November 03, 2010 4:15 PM     Additional Follow-up for Phone Call Additional follow up Details #2::    I spoke w/ pt and she is aware that there was no UTI and no ATB's needed. Army Fossa CMA  November 06, 2010 2:15 PM

## 2011-03-29 ENCOUNTER — Other Ambulatory Visit: Payer: Self-pay | Admitting: Internal Medicine

## 2011-03-29 NOTE — Telephone Encounter (Signed)
Lipid/Hep 272.4/995.20  

## 2011-05-09 ENCOUNTER — Other Ambulatory Visit: Payer: Self-pay | Admitting: Internal Medicine

## 2011-05-09 DIAGNOSIS — Z1231 Encounter for screening mammogram for malignant neoplasm of breast: Secondary | ICD-10-CM

## 2011-05-25 ENCOUNTER — Other Ambulatory Visit: Payer: Self-pay | Admitting: Internal Medicine

## 2011-06-14 ENCOUNTER — Other Ambulatory Visit: Payer: Self-pay | Admitting: Internal Medicine

## 2011-06-19 ENCOUNTER — Ambulatory Visit
Admission: RE | Admit: 2011-06-19 | Discharge: 2011-06-19 | Disposition: A | Discharge status: Home / Self Care | Payer: Medicare Other | Source: Ambulatory Visit | Attending: Internal Medicine | Admitting: Internal Medicine

## 2011-06-19 DIAGNOSIS — Z1231 Encounter for screening mammogram for malignant neoplasm of breast: Secondary | ICD-10-CM

## 2011-07-31 ENCOUNTER — Other Ambulatory Visit: Payer: Self-pay | Admitting: Internal Medicine

## 2011-08-01 NOTE — Telephone Encounter (Signed)
Lipid/Hep 272.4/995.20  

## 2011-08-16 ENCOUNTER — Other Ambulatory Visit: Payer: Self-pay | Admitting: Obstetrics and Gynecology

## 2011-08-16 DIAGNOSIS — N644 Mastodynia: Secondary | ICD-10-CM

## 2011-08-24 ENCOUNTER — Ambulatory Visit
Admission: RE | Admit: 2011-08-24 | Discharge: 2011-08-24 | Disposition: A | Discharge status: Home / Self Care | Payer: Medicare Other | Source: Ambulatory Visit | Attending: Obstetrics and Gynecology | Admitting: Obstetrics and Gynecology

## 2011-08-24 DIAGNOSIS — N644 Mastodynia: Secondary | ICD-10-CM

## 2011-10-22 ENCOUNTER — Other Ambulatory Visit: Payer: Self-pay | Admitting: Internal Medicine

## 2011-10-22 NOTE — Telephone Encounter (Signed)
Patient needs to schedule a CPX  

## 2011-10-22 NOTE — Telephone Encounter (Signed)
RX called in, would not go through electronically  

## 2011-11-16 ENCOUNTER — Other Ambulatory Visit: Payer: Self-pay | Admitting: Internal Medicine

## 2011-12-19 DIAGNOSIS — H259 Unspecified age-related cataract: Secondary | ICD-10-CM | POA: Diagnosis not present

## 2011-12-19 DIAGNOSIS — H2510 Age-related nuclear cataract, unspecified eye: Secondary | ICD-10-CM | POA: Diagnosis not present

## 2012-01-08 DIAGNOSIS — IMO0002 Reserved for concepts with insufficient information to code with codable children: Secondary | ICD-10-CM | POA: Diagnosis not present

## 2012-01-08 DIAGNOSIS — H2510 Age-related nuclear cataract, unspecified eye: Secondary | ICD-10-CM | POA: Diagnosis not present

## 2012-02-13 DIAGNOSIS — Z961 Presence of intraocular lens: Secondary | ICD-10-CM | POA: Diagnosis not present

## 2012-02-19 ENCOUNTER — Ambulatory Visit (INDEPENDENT_AMBULATORY_CARE_PROVIDER_SITE_OTHER)
Admission: RE | Admit: 2012-02-19 | Discharge: 2012-02-19 | Disposition: A | Discharge status: Home / Self Care | Payer: Medicare Other | Source: Ambulatory Visit | Attending: Internal Medicine | Admitting: Internal Medicine

## 2012-02-19 ENCOUNTER — Other Ambulatory Visit: Payer: Self-pay | Admitting: Internal Medicine

## 2012-02-19 ENCOUNTER — Ambulatory Visit (INDEPENDENT_AMBULATORY_CARE_PROVIDER_SITE_OTHER): Payer: Medicare Other | Admitting: Internal Medicine

## 2012-02-19 ENCOUNTER — Encounter: Payer: Self-pay | Admitting: Internal Medicine

## 2012-02-19 VITALS — BP 128/76 | HR 75 | Temp 98.5°F | Wt 156.8 lb

## 2012-02-19 DIAGNOSIS — R0602 Shortness of breath: Secondary | ICD-10-CM

## 2012-02-19 DIAGNOSIS — R9431 Abnormal electrocardiogram [ECG] [EKG]: Secondary | ICD-10-CM | POA: Insufficient documentation

## 2012-02-19 DIAGNOSIS — I10 Essential (primary) hypertension: Secondary | ICD-10-CM | POA: Diagnosis not present

## 2012-02-19 DIAGNOSIS — L299 Pruritus, unspecified: Secondary | ICD-10-CM

## 2012-02-19 DIAGNOSIS — M5414 Radiculopathy, thoracic region: Secondary | ICD-10-CM

## 2012-02-19 DIAGNOSIS — IMO0002 Reserved for concepts with insufficient information to code with codable children: Secondary | ICD-10-CM

## 2012-02-19 DIAGNOSIS — K449 Diaphragmatic hernia without obstruction or gangrene: Secondary | ICD-10-CM | POA: Diagnosis not present

## 2012-02-19 DIAGNOSIS — C55 Malignant neoplasm of uterus, part unspecified: Secondary | ICD-10-CM

## 2012-02-19 DIAGNOSIS — M4120 Other idiopathic scoliosis, site unspecified: Secondary | ICD-10-CM | POA: Diagnosis not present

## 2012-02-19 DIAGNOSIS — M47814 Spondylosis without myelopathy or radiculopathy, thoracic region: Secondary | ICD-10-CM | POA: Diagnosis not present

## 2012-02-19 MED ORDER — GABAPENTIN 100 MG PO CAPS: 100.0000 mg | ORAL_CAPSULE | Freq: Three times a day (TID) | ORAL | Status: AC

## 2012-02-19 NOTE — Patient Instructions (Addendum)
Order for x-rays entered into  the computer; these will be performed at [address] across from [facility]. No appointment is necessary. 

## 2012-02-19 NOTE — Progress Notes (Signed)
  Subjective:    Patient ID: Toni Hayes, female    DOB: 14-Jan-1924, 78 y.o.   MRN: [id number]  HPI #1 BACK PAIN: Onset:1-2 years ago; last episode 2 days ago Location: R scapular area  Quality: sharp - dull Worse with: sitting@ desk or standing over sink. Worse after radiation 1 year ago  Better with:supine position , hot packs, ES Tylenol 3 X/ day  Radiation: no Trauma: no Red Flags Fecal/urinary incontinence: no Numbness/Weakness: no  Fever/chills/sweats:no Night pain: no  Unexplained weight loss:no  h/o cancer/immunosuppression: Gyn cancer  PMH of osteoporosis ;no chronic steroid use  #2 DYSPNEA: Onset:2-3 mos ago Context/character:walking up incline or with stairs Severity/limitation:avoids stairs Modifying factors:no treatment Associated signs/symptoms:no  Treatment/Response:rest   Past medical history: No pulmonary disease; PMH of TIA , on Plavix Smoking history:quit 1972 Family history:no cardiopulmonary disease.        Review of Systems Constitutional: No fever, chills, significant weight change,  fatigue, night sweats Cardiovascular: No chest pain, palpitations, racing, irregularity, syncope, diaphoresis, claudication Respiratory: No cough, sputum production,hemoptysis, pleurisy GI: No reflux, dysphagia, melena, rectal bleeding Endocrine: No skin/hair/nail changes now. Itching  X 1 month ; resolved 2 weeks ago after detergent change Neuro/Muscular: No weakness, tremor, gait dysfunction Heme/lymph: Some Plavix related  bruising,no lymphadenopathy Allergy/immunologic: No rhinoconjunctivitis, angioedema     Objective:   Physical Exam Gen.:  well-nourished in appearance. Alert, appropriate and cooperative throughout exam.Appears younger than stated age  Head: Normocephalic without obvious abnormalities Eyes: No corneal or conjunctival inflammation noted.No icterus Mouth: Oral mucosa and oropharynx reveal no lesions or exudates. Teeth in good repair. Neck:  No deformities, masses, or tenderness noted. Range of motion & Thyroid normal Lungs: Normal respiratory effort; chest expands symmetrically. Lungs are clear to auscultation without rales, wheezes, or increased work of breathing. Heart: Normal rate and rhythm. Normal S1 ; slight splitting  S2. No gallop, click, or rub. No  murmur. Abdomen: Bowel sounds normal; abdomen soft and nontender. No masses, organomegaly or hernias noted.  Musculoskeletal/extremities: marked lordosis noted of  the thoracic  spine. No clubbing, cyanosis, edema, or deformity noted. Tone & strength  normal.Joints normal. Nail health  good. Vascular: Carotid, radial artery, dorsalis pedis and  posterior tibial pulses are full and equal. No bruits present. Neurologic: Alert and oriented x3. Deep tendon reflexes symmetrical and normal.       Skin: Intact without suspicious lesions or rashes. Lymph: No cervical, axillary lymphadenopathy present. Psych: Mood and affect are normal. Normally interactive                                                                                         Assessment & Plan:  #1 chronic recurrent right back pain; probable T5 or T6 radicular pain related to thoracic lordosis. Because of chronicity, films indicated.  #2 dyspnea on exertion. EKG is basically unchanged compared to 09/02/2006 except for probable transitional changes in V2. There are no definite ischemic changes noted. EKG 12/31/08 is probably exhibiting lead misplacement based on the negative forces in lead 1. Cardiology evaluation indicated because of exertional dyspnea  #3 skin dryness, resolved; probably related to detergent

## 2012-02-21 ENCOUNTER — Encounter: Payer: Self-pay | Admitting: Internal Medicine

## 2012-02-21 ENCOUNTER — Telehealth: Payer: Self-pay

## 2012-02-21 NOTE — Telephone Encounter (Signed)
Message copied by Maurice Small on Thu Feb 21, 2012 10:50 AM ------      Message from: Pecola Lawless      Created: Tue Feb 19, 2012  5:58 PM       Scoliosis or side to side curvature and osteoarthritic changes are present. Bone mineral density should be performed every 25 months to rule out osteoporosis. [employer]

## 2012-02-21 NOTE — Telephone Encounter (Signed)
Spoke with patient, last BMD was 12/2008. Patient verbalized understanding of results, patient would like BMD scheduled

## 2012-03-04 ENCOUNTER — Other Ambulatory Visit: Payer: Self-pay | Admitting: Internal Medicine

## 2012-03-04 DIAGNOSIS — N959 Unspecified menopausal and perimenopausal disorder: Secondary | ICD-10-CM

## 2012-03-04 NOTE — Telephone Encounter (Signed)
Dr.Hopper please place order for BMD

## 2012-03-15 ENCOUNTER — Other Ambulatory Visit: Payer: Self-pay | Admitting: Internal Medicine

## 2012-03-17 NOTE — Telephone Encounter (Signed)
Lipid/hep 272.4/995.20  

## 2012-03-18 ENCOUNTER — Encounter: Payer: Self-pay | Admitting: *Deleted

## 2012-03-21 ENCOUNTER — Encounter: Payer: Self-pay | Admitting: *Deleted

## 2012-03-24 ENCOUNTER — Ambulatory Visit (INDEPENDENT_AMBULATORY_CARE_PROVIDER_SITE_OTHER): Payer: Medicare Other | Admitting: Cardiovascular Disease

## 2012-03-24 ENCOUNTER — Encounter: Payer: Self-pay | Admitting: Cardiovascular Disease

## 2012-03-24 VITALS — BP 144/78 | HR 76 | Ht 59.0 in | Wt 160.4 lb

## 2012-03-24 DIAGNOSIS — R0989 Other specified symptoms and signs involving the circulatory and respiratory systems: Secondary | ICD-10-CM

## 2012-03-24 DIAGNOSIS — R0600 Dyspnea, unspecified: Secondary | ICD-10-CM | POA: Insufficient documentation

## 2012-03-24 DIAGNOSIS — R9431 Abnormal electrocardiogram [ECG] [EKG]: Secondary | ICD-10-CM | POA: Diagnosis not present

## 2012-03-24 DIAGNOSIS — R0609 Other forms of dyspnea: Secondary | ICD-10-CM

## 2012-03-24 NOTE — Progress Notes (Addendum)
Toni Hayes Date of Birth  [birth year] [facility]     [place] Office  [address]    [address]   [address] [place], [place]  [address]    [place], [place]  [address] [phone]  Fax  [phone]  [phone]  Fax [phone]  Problems: 1. Dyspnea - especially with exertion 2. Hyperlipidemia 3. Hysterectomy 4. Cholecyctectomy 5. TIA - 1998 , on Plavix 6. Hypertension    History of Present Illness:  Toni Hayes is an 78 y.o. yo female who presents for evaluation of DOE.  She has dyspnea climbing stairs and with other physical exertion. She was recently seen by Dr. Alwyn Ren for back pain.  She has had progressive DOE for the past 6 months.  Worse over the past 3 months.  The dyspnea resolves with sitting.  She denies any chest pain / pressure, diaphoresis, n/v.  She has some mild leg swelling - not worsened over the past 6 months.    Current Outpatient Prescriptions on File Prior to Visit  Medication Sig Dispense Refill  . amLODipine (NORVASC) 5 MG tablet TAKE 1 TABLET EVERY DAY  30 tablet  1  . Calcium Carbonate (CALCIUM 500 PO) Take 1,200 mg by mouth 2 (two) times daily.      Marland Kitchen PLAVIX 75 MG tablet TAKE 1 TABLET BY MOUTH EVERY DAY  90 tablet  1  . pravastatin (PRAVACHOL) 20 MG tablet TAKE ONE TABLET BY MOUTH EVERY DAY AT BEDTIME  30 tablet  0  . gabapentin (NEURONTIN) 100 MG capsule Take 1 capsule (100 mg total) by mouth 3 (three) times daily.  30 capsule  2    Allergies  Allergen Reactions  . Codeine     rash    Past Medical History  Diagnosis Date  . B12 deficiency   . Malignant neoplasm of uterus   . Hyperlipemia   . Addison anemia   . Carpal tunnel syndrome, right   . HTN (hypertension)     Essential NOS  . Esophageal stricture   . GERD (gastroesophageal reflux disease)   . Hemorrhage of rectum and anus   . Back pain, thoracic     region  . Disorder of bone and cartilage   . Sleep disorder   . Urinary frequency   . Circulatory  disease     Personal Hx of unspecified  . Hx of colonic polyps   . Abnormal ECG     Past Surgical History  Procedure Date  . Cholecystectomy   . Breast lumpectomy   . Esophageal dilation 2002  . Colonoscopy w/ biopsies and polypectomy   . Esophageal dilation 2006  . Ankle fracture surgery   . Wrist fracture surgery     left arm    History  Smoking status  . Former Smoker -- 1.0 packs/day for 40 years  . Quit date: 11/26/1972  Smokeless tobacco  . Never Used  Comment: quit about age 71    History  Alcohol Use No    Family History  Problem Relation Age of Onset  . Breast cancer Mother   . Stroke Father   . Diabetes Brother   . Heart attack Brother   . Lung cancer Brother   . Liver cancer Brother   . Colon cancer Son     Reviw of Systems:  Reviewed in the HPI.  All other systems are negative.  Physical Exam: Blood pressure 144/78, pulse 76, height 4\' 11"  (1.499 m), weight 160  lb 6.4 oz (72.757 kg). General: Well developed, well nourished, in no acute distress.  Head: Normocephalic, atraumatic, sclera non-icteric, mucus membranes are moist,   Neck: Supple. Carotids are 2 + without bruits. No JVD  Lungs: Clear bilaterally to auscultation.  Heart: regular rate.  normal  S1 S2. No murmurs, gallops or rubs.  Abdomen: Soft, non-tender, non-distended with normal bowel sounds. No hepatomegaly. No rebound/guarding. No masses.  Msk:  Strength and tone are normal  Extremities: No clubbing or cyanosis. No edema.  Distal pedal pulses are 2+ and equal bilaterally.  Neuro: Alert and oriented X 3. Moves all extremities spontaneously.  Psych:  Responds to questions appropriately with a normal affect.  ECG: 03/24/2012-normal sinus rhythm with sinus arrhythmia. She has an incomplete right bundle branch block. There is poor R wave progression.  Assessment / Plan:

## 2012-03-24 NOTE — Patient Instructions (Signed)
Your physician has requested that you have a lexiscan myoview.  Please follow instruction sheet, as given.  Your physician has requested that you have an echocardiogram. Echocardiography is a painless test that uses sound waves to create images of your heart. It provides your doctor with information about the size and shape of your heart and how well your heart's chambers and valves are working. This procedure takes approximately one hour. There are no restrictions for this procedure.  Your physician recommends that you schedule a follow-up appointment in: 2 months

## 2012-03-24 NOTE — Assessment & Plan Note (Signed)
Toni Hayes presents today with dyspnea on exertion for the past 6 months. She thinks that it might worsen for the past 3 months. She denies having any episodes of chest pain or chest pressure. She previously has been very healthy and typically does all of her yard work. She's now having some limitations in doing her activities. She's not had any episodes of shortness breath at rest.  We'll schedule her for a Lexus scan Myoview study for further evaluation of her shortness breath. She has poor R-wave progression on her EKG and may have had a previous anterior wall myocardial infarction.  We'll also get an echocardiogram. I suspect she may have some degree of diastolic dysfunction.

## 2012-03-26 HISTORY — PX: OTHER SURGICAL HISTORY: SHX169

## 2012-03-27 ENCOUNTER — Ambulatory Visit (HOSPITAL_COMMUNITY): Payer: Medicare Other | Attending: Cardiology | Admitting: Radiology

## 2012-03-27 VITALS — BP 125/77 | Ht 59.0 in | Wt 155.0 lb

## 2012-03-27 DIAGNOSIS — R0609 Other forms of dyspnea: Secondary | ICD-10-CM | POA: Diagnosis not present

## 2012-03-27 DIAGNOSIS — I10 Essential (primary) hypertension: Secondary | ICD-10-CM | POA: Insufficient documentation

## 2012-03-27 DIAGNOSIS — E785 Hyperlipidemia, unspecified: Secondary | ICD-10-CM | POA: Diagnosis not present

## 2012-03-27 DIAGNOSIS — R5383 Other fatigue: Secondary | ICD-10-CM | POA: Insufficient documentation

## 2012-03-27 DIAGNOSIS — R0602 Shortness of breath: Secondary | ICD-10-CM | POA: Diagnosis not present

## 2012-03-27 DIAGNOSIS — R5381 Other malaise: Secondary | ICD-10-CM | POA: Insufficient documentation

## 2012-03-27 DIAGNOSIS — E669 Obesity, unspecified: Secondary | ICD-10-CM | POA: Diagnosis not present

## 2012-03-27 DIAGNOSIS — R0600 Dyspnea, unspecified: Secondary | ICD-10-CM

## 2012-03-27 DIAGNOSIS — Z87891 Personal history of nicotine dependence: Secondary | ICD-10-CM | POA: Insufficient documentation

## 2012-03-27 DIAGNOSIS — R0989 Other specified symptoms and signs involving the circulatory and respiratory systems: Secondary | ICD-10-CM | POA: Insufficient documentation

## 2012-03-27 DIAGNOSIS — Z8673 Personal history of transient ischemic attack (TIA), and cerebral infarction without residual deficits: Secondary | ICD-10-CM | POA: Diagnosis not present

## 2012-03-27 DIAGNOSIS — R9431 Abnormal electrocardiogram [ECG] [EKG]: Secondary | ICD-10-CM | POA: Diagnosis not present

## 2012-03-27 DIAGNOSIS — Z8249 Family history of ischemic heart disease and other diseases of the circulatory system: Secondary | ICD-10-CM | POA: Diagnosis not present

## 2012-03-27 MED ORDER — REGADENOSON 0.4 MG/5ML IV SOLN
0.4000 mg | Freq: Once | INTRAVENOUS | Status: AC
  Administered 2012-03-27: 0.4 mg via INTRAVENOUS

## 2012-03-27 MED ORDER — TECHNETIUM TC 99M TETROFOSMIN IV KIT
11.0000 | PACK | Freq: Once | INTRAVENOUS | Status: AC | PRN
  Administered 2012-03-27: 11 via INTRAVENOUS

## 2012-03-27 MED ORDER — TECHNETIUM TC 99M TETROFOSMIN IV KIT
33.0000 | PACK | Freq: Once | INTRAVENOUS | Status: AC | PRN
  Administered 2012-03-27: 33 via INTRAVENOUS

## 2012-03-27 NOTE — Progress Notes (Signed)
[facility] SITE 3 NUCLEAR MED [address] [place] [address] [phone]  Cardiology Nuclear Med Study  Toni Hayes is a 78 y.o. female     MRN : [id number]     DOB: August 12, 1924  Procedure Date: 03/27/2012  Nuclear Med Background Indication for Stress Test:  Evaluation for Ischemia History:  No documented  CAD Cardiac Risk Factors: Family History - CAD, History of Smoking, Hypertension, Lipids, Obesity and TIA  Symptoms:  DOE and Fatigue   Nuclear Pre-Procedure Caffeine/Decaff Intake:  None> 12 hrs NPO After: 8:00am   Lungs:  Clear. O2 Sat: 97% on room air. IV 0.9% NS with Angio Cath:  22g  IV Site: R Antecubital x 1, tolerated well IV Started by:  Irean Hong, RN  Chest Size (in):  38 Cup Size: B  Height: 4\' 11"  (1.499 m)  Weight:  155 lb (70.308 kg)  BMI:  Body mass index is 31.31 kg/(m^2). Tech Comments:  n/a    Nuclear Med Study 1 or 2 day study: 1 day  Stress Test Type:  Treadmill/Lexiscan  Reading MD: Cassell Clement, MD  Order Authorizing Provider:  Kristeen Miss, MD  Resting Radionuclide: Technetium 78m Tetrofosmin  Resting Radionuclide Dose: 11.0 mCi   Stress Radionuclide:  Technetium 28m Tetrofosmin  Stress Radionuclide Dose: 32.9 mCi           Stress Protocol Rest HR: 72 Stress HR: 133  Rest BP: Sitting 125/77  Standing 142/85 Stress BP: 167/68  Exercise Time (min): 2:00 METS: n/a   Predicted Max HR: 135 bpm % Max HR: 98.52 bpm Rate Pressure Product: 21308   Dose of Adenosine (mg):  n/a Dose of Lexiscan: 0.4 mg  Dose of Atropine (mg): n/a Dose of Dobutamine: n/a mcg/kg/min (at max HR)  Stress Test Technologist: Smiley Houseman, CMA-N  Nuclear Technologist:  Domenic Polite, CNMT     Rest Procedure:  Myocardial perfusion imaging was performed at rest 45 minutes following the intravenous administration of Technetium 59m Tetrofosmin.  Rest ECG: RBBB/LAFB.  Stress Procedure:  The patient received IV Lexiscan 0.4 mg over  15-seconds with concurrent low level exercise and then Technetium 55m Tetrofosmin was injected at 30-seconds while the patient continued walking one more minute. There were no significant changes with Lexiscan bolus, occasional PAC's were noted. Quantitative spect images were obtained after a 45-minute delay.  Stress ECG: No significant change from baseline ECG  QPS Raw Data Images:  Normal; no motion artifact; normal heart/lung ratio. Stress Images:  Normal homogeneous uptake in all areas of the myocardium. Rest Images:  Normal homogeneous uptake in all areas of the myocardium. Subtraction (SDS):  No evidence of ischemia. Transient Ischemic Dilatation (Normal <1.22):  0.99 Lung/Heart Ratio (Normal <0.45):  0.31  Quantitative Gated Spect Images QGS EDV:  64 ml QGS ESV:  19 ml  Impression Exercise Capacity:  Lexiscan with low level exercise. BP Response:  Normal blood pressure response. Clinical Symptoms:  No chest pain. ECG Impression:  No significant ST segment change suggestive of ischemia. Comparison with Prior Nuclear Study: No previous nuclear study performed  Overall Impression:  Normal stress nuclear study.  LV Ejection Fraction: 70%.  LV Wall Motion:  NL LV Function; NL Wall Motion  [employer]

## 2012-04-02 ENCOUNTER — Ambulatory Visit (HOSPITAL_COMMUNITY): Payer: Medicare Other | Attending: Cardiovascular Disease

## 2012-04-02 ENCOUNTER — Other Ambulatory Visit: Payer: Self-pay

## 2012-04-02 DIAGNOSIS — I10 Essential (primary) hypertension: Secondary | ICD-10-CM | POA: Insufficient documentation

## 2012-04-02 DIAGNOSIS — R0600 Dyspnea, unspecified: Secondary | ICD-10-CM

## 2012-04-02 DIAGNOSIS — R0609 Other forms of dyspnea: Secondary | ICD-10-CM | POA: Diagnosis not present

## 2012-04-02 DIAGNOSIS — R0989 Other specified symptoms and signs involving the circulatory and respiratory systems: Secondary | ICD-10-CM | POA: Diagnosis not present

## 2012-04-02 DIAGNOSIS — E785 Hyperlipidemia, unspecified: Secondary | ICD-10-CM | POA: Insufficient documentation

## 2012-04-02 DIAGNOSIS — R9431 Abnormal electrocardiogram [ECG] [EKG]: Secondary | ICD-10-CM

## 2012-04-03 ENCOUNTER — Other Ambulatory Visit: Payer: Self-pay | Admitting: Internal Medicine

## 2012-04-15 DIAGNOSIS — C549 Malignant neoplasm of corpus uteri, unspecified: Secondary | ICD-10-CM | POA: Diagnosis not present

## 2012-04-29 DIAGNOSIS — C549 Malignant neoplasm of corpus uteri, unspecified: Secondary | ICD-10-CM | POA: Diagnosis not present

## 2012-04-29 DIAGNOSIS — I10 Essential (primary) hypertension: Secondary | ICD-10-CM | POA: Diagnosis not present

## 2012-04-29 DIAGNOSIS — Z8673 Personal history of transient ischemic attack (TIA), and cerebral infarction without residual deficits: Secondary | ICD-10-CM | POA: Diagnosis not present

## 2012-04-29 DIAGNOSIS — Z79899 Other long term (current) drug therapy: Secondary | ICD-10-CM | POA: Diagnosis not present

## 2012-05-06 DIAGNOSIS — Z09 Encounter for follow-up examination after completed treatment for conditions other than malignant neoplasm: Secondary | ICD-10-CM | POA: Diagnosis not present

## 2012-05-06 DIAGNOSIS — K449 Diaphragmatic hernia without obstruction or gangrene: Secondary | ICD-10-CM | POA: Diagnosis not present

## 2012-05-06 DIAGNOSIS — C55 Malignant neoplasm of uterus, part unspecified: Secondary | ICD-10-CM | POA: Diagnosis not present

## 2012-05-06 DIAGNOSIS — M549 Dorsalgia, unspecified: Secondary | ICD-10-CM | POA: Diagnosis not present

## 2012-05-06 DIAGNOSIS — J9819 Other pulmonary collapse: Secondary | ICD-10-CM | POA: Diagnosis not present

## 2012-05-06 DIAGNOSIS — Z8542 Personal history of malignant neoplasm of other parts of uterus: Secondary | ICD-10-CM | POA: Diagnosis not present

## 2012-05-06 DIAGNOSIS — R911 Solitary pulmonary nodule: Secondary | ICD-10-CM | POA: Diagnosis not present

## 2012-05-21 ENCOUNTER — Ambulatory Visit: Payer: Medicare Other | Admitting: Cardiovascular Disease

## 2012-07-07 ENCOUNTER — Other Ambulatory Visit: Payer: Self-pay | Admitting: Radiology

## 2012-07-07 DIAGNOSIS — Z1231 Encounter for screening mammogram for malignant neoplasm of breast: Secondary | ICD-10-CM

## 2012-07-18 DIAGNOSIS — L290 Pruritus ani: Secondary | ICD-10-CM | POA: Diagnosis not present

## 2012-07-18 DIAGNOSIS — L821 Other seborrheic keratosis: Secondary | ICD-10-CM | POA: Diagnosis not present

## 2012-07-21 DIAGNOSIS — L299 Pruritus, unspecified: Secondary | ICD-10-CM | POA: Diagnosis not present

## 2012-07-22 ENCOUNTER — Ambulatory Visit: Payer: Medicare Other

## 2012-07-23 ENCOUNTER — Encounter: Payer: Self-pay | Admitting: Internal Medicine

## 2012-07-23 DIAGNOSIS — D649 Anemia, unspecified: Secondary | ICD-10-CM | POA: Insufficient documentation

## 2012-07-24 ENCOUNTER — Telehealth: Payer: Self-pay | Admitting: Internal Medicine

## 2012-07-24 NOTE — Telephone Encounter (Signed)
Message copied by Marshell Garfinkel on Thu Jul 24, 2012  4:41 PM ------      Message from: Pecola Lawless      Created: Wed Jul 23, 2012  5:32 PM       Anemia is new based on labs in chart &it is  significant ;please  repeat CBC with iron panel & B12  Levels & pick up stool cards(Code: 285.9).

## 2012-07-24 NOTE — Telephone Encounter (Signed)
Pt coming in tomorrow, 8/30, for labs.

## 2012-07-25 ENCOUNTER — Other Ambulatory Visit (INDEPENDENT_AMBULATORY_CARE_PROVIDER_SITE_OTHER): Payer: Medicare Other

## 2012-07-25 DIAGNOSIS — E538 Deficiency of other specified B group vitamins: Secondary | ICD-10-CM

## 2012-07-25 DIAGNOSIS — D649 Anemia, unspecified: Secondary | ICD-10-CM

## 2012-07-25 LAB — CBC WITH DIFFERENTIAL/PLATELET
Basophils Absolute: 0 10*3/uL (ref 0.0–0.1)
Basophils Relative: 0.8 % (ref 0.0–3.0)
Eosinophils Absolute: 0.2 10*3/uL (ref 0.0–0.7)
Hemoglobin: 8.1 g/dL — ABNORMAL LOW (ref 12.0–15.0)
MCHC: 28.6 g/dL — ABNORMAL LOW (ref 30.0–36.0)
MCV: 66.1 fl — ABNORMAL LOW (ref 78.0–100.0)
Monocytes Absolute: 0.5 10*3/uL (ref 0.1–1.0)
Neutro Abs: 3.2 10*3/uL (ref 1.4–7.7)
RBC: 4.3 Mil/uL (ref 3.87–5.11)
RDW: 20.6 % — ABNORMAL HIGH (ref 11.5–14.6)

## 2012-07-25 NOTE — Progress Notes (Signed)
Labs only

## 2012-07-27 ENCOUNTER — Telehealth: Payer: Self-pay | Admitting: Internal Medicine

## 2012-07-27 NOTE — Telephone Encounter (Signed)
I discussed the labs with her daughter Earlean Shawl. According to her daughter she's asymptomatic; she is to report any rectal bleeding, melena, chest pain or shortness of breath immediately. Otherwise she is to call 9/3 for an appointment. Verification of healthcare power of attorney will be completed at the office visit. My Chart registration will be encouraged to facilitate optimal communications.

## 2012-07-31 ENCOUNTER — Encounter: Payer: Self-pay | Admitting: Gastroenterology

## 2012-07-31 ENCOUNTER — Encounter: Payer: Self-pay | Admitting: Internal Medicine

## 2012-07-31 ENCOUNTER — Ambulatory Visit (INDEPENDENT_AMBULATORY_CARE_PROVIDER_SITE_OTHER): Payer: Medicare Other | Admitting: Internal Medicine

## 2012-07-31 VITALS — BP 140/76 | HR 80 | Temp 98.6°F | Wt 155.8 lb

## 2012-07-31 DIAGNOSIS — K219 Gastro-esophageal reflux disease without esophagitis: Secondary | ICD-10-CM

## 2012-07-31 DIAGNOSIS — E538 Deficiency of other specified B group vitamins: Secondary | ICD-10-CM | POA: Diagnosis not present

## 2012-07-31 DIAGNOSIS — R911 Solitary pulmonary nodule: Secondary | ICD-10-CM

## 2012-07-31 DIAGNOSIS — C55 Malignant neoplasm of uterus, part unspecified: Secondary | ICD-10-CM

## 2012-07-31 DIAGNOSIS — D509 Iron deficiency anemia, unspecified: Secondary | ICD-10-CM | POA: Diagnosis not present

## 2012-07-31 DIAGNOSIS — Z8673 Personal history of transient ischemic attack (TIA), and cerebral infarction without residual deficits: Secondary | ICD-10-CM

## 2012-07-31 DIAGNOSIS — Z860100 Personal history of colon polyps, unspecified: Secondary | ICD-10-CM

## 2012-07-31 DIAGNOSIS — K222 Esophageal obstruction: Secondary | ICD-10-CM

## 2012-07-31 MED ORDER — CYANOCOBALAMIN 1000 MCG/ML IJ SOLN
1000.0000 ug | Freq: Once | INTRAMUSCULAR | Status: AC
  Administered 2012-07-31: 1000 ug via INTRAMUSCULAR

## 2012-07-31 MED ORDER — RANITIDINE HCL 150 MG PO TABS: 150.0000 mg | ORAL_TABLET | Freq: Two times a day (BID) | ORAL | Status: DC

## 2012-07-31 NOTE — Progress Notes (Signed)
  Subjective:    Patient ID: Toni Hayes, female    DOB: 08-15-1924, 78 y.o.   MRN: [id number]  HPI She was seen by her dermatologist to assess nevi of thorax. She was found to be significantly anemic at that time. Her repeat CBC here was essentially stable on 07/25/12. Specifically hemoglobin was 8.1/hematocrit 28.4, microcytic/ hypochromic. Differential was normal. B12 level was significantly decreased at 249 and her iron level was decreased at 17.  Past medical history/family history/social history were all reviewed and updated. Pertinent data: Past history of esophageal dilations on @ least one occasion;  colonic polyps; status post total abdominal hysterectomy and bilateral oophorectomy for uterine malignancy; B12 deficiency; low-dose aspirin and Plavix therapy for past history of TIA  Review of Systems She describes fatigue as well as dyspnea for 6-8 mos. She denies epistaxis, hemoptysis,  abdominal pain, weight loss, melena, rectal bleeding, hematuria, or vaginal bleeding. She also denies abnormal bruising or bleeding. She has occassional dysphagia.  She states that her surgeon at [facility] recommended followup of a small R  lung nodule found on CAT scan in 7/13     Objective:   Physical Exam General appearance is one of good health and nourishment w/o distress.Appears younger than stated age   Eyes: No conjunctival inflammation or scleral icterus is present.  Oral exam: Dental hygiene is good; lips and gums are healthy appearing.There is no oropharyngeal erythema or exudate noted.   Heart:  Normal rate and regular rhythm. S1 and S2 normal without gallop, murmur, click, rub or other extra sounds    Lungs:Chest clear to auscultation; no wheezes, rhonchi,rales ,or rubs present.No increased work of breathing.   Abdomen: bowel sounds normal, soft and non-tender without masses, organomegaly or hernias noted.  No guarding or rebound   Skin:Warm & dry.  Intact without suspicious lesions  or rashes ; no jaundice or tenting  Lymphatic: No lymphadenopathy is noted about the head, neck, axilla, or inguinal areas.              Assessment & Plan:  #1 significant anemia; probably multifactorial, symptomatic with fatigue and dyspnea #2 documented B12 deficiency #3 documented iron deficiency #4 occasional dysphagia; past medical history of esophageal stricture #5 history of colon polyps; probably noncontributory #6 history of uterine malignancy; status post total  hysterectomy, probably noncontributory but see #7 #7 pulmonary nodule, incidental finding 7/13 #8 past history of TIA; low-dose aspirin and Plavix therapy. PPI relatively contraindicated  Plan: Initiate B12 injections today. Recheck CBC to verify stability of anemia. Check stool cards (pending). GI referral. CXR

## 2012-07-31 NOTE — Patient Instructions (Addendum)
Order for x-rays entered into  the computer; these will be performed at [address] across from [facility]. No appointment is necessary.  Review and correct the record as indicated. Please share record with all medical staff seen.   If you activate My Chart; the results can be released to you as soon as they populate from the lab. If you choose not to use this program; the labs have to be reviewed, copied & mailed   causing a delay in getting the results to you.  

## 2012-07-31 NOTE — Addendum Note (Signed)
Addended by: Maurice Small on: 07/31/2012 04:19 PM   Modules accepted: Orders

## 2012-08-01 ENCOUNTER — Ambulatory Visit (INDEPENDENT_AMBULATORY_CARE_PROVIDER_SITE_OTHER)
Admission: RE | Admit: 2012-08-01 | Discharge: 2012-08-01 | Disposition: A | Discharge status: Home / Self Care | Payer: Medicare Other | Source: Ambulatory Visit | Attending: Internal Medicine | Admitting: Internal Medicine

## 2012-08-01 DIAGNOSIS — R911 Solitary pulmonary nodule: Secondary | ICD-10-CM

## 2012-08-01 DIAGNOSIS — R0602 Shortness of breath: Secondary | ICD-10-CM | POA: Diagnosis not present

## 2012-08-01 LAB — CBC WITH DIFFERENTIAL/PLATELET
Basophils Relative: 1.7 % (ref 0.0–3.0)
Eosinophils Relative: 1.8 % (ref 0.0–5.0)
HCT: 27.8 % — ABNORMAL LOW (ref 36.0–46.0)
Hemoglobin: 8.1 g/dL — ABNORMAL LOW (ref 12.0–15.0)
Lymphs Abs: 1.6 10*3/uL (ref 0.7–4.0)
MCHC: 29.3 g/dL — ABNORMAL LOW (ref 30.0–36.0)
MCV: 64.8 fl — ABNORMAL LOW (ref 78.0–100.0)
Monocytes Absolute: 0.4 10*3/uL (ref 0.1–1.0)
Neutro Abs: 3.7 10*3/uL (ref 1.4–7.7)
RBC: 4.28 Mil/uL (ref 3.87–5.11)
WBC: 5.9 10*3/uL (ref 4.5–10.5)

## 2012-08-04 ENCOUNTER — Other Ambulatory Visit (INDEPENDENT_AMBULATORY_CARE_PROVIDER_SITE_OTHER): Payer: Medicare Other

## 2012-08-04 DIAGNOSIS — Z1289 Encounter for screening for malignant neoplasm of other sites: Secondary | ICD-10-CM

## 2012-08-05 ENCOUNTER — Telehealth: Payer: Self-pay | Admitting: *Deleted

## 2012-08-05 NOTE — Telephone Encounter (Signed)
Scheduled pt to see Willette Cluster, NP on 08/07/12 per Dr Jarold Motto. Pt asked to have her B12 done here so she didn't have to go to 2 md's ofc and it's OK per Darcey Nora, lead RN.  Pt stated understanding.

## 2012-08-05 NOTE — Telephone Encounter (Signed)
Message copied by Florene Glen on Tue Aug 05, 2012  2:15 PM ------      Message from: Jarold Motto, DAVID R      Created: Tue Aug 05, 2012  1:56 PM       PA or doc of day ASAP.Marland Kitcheni agree      ----- Message -----         From: Pecola Lawless, MD         Sent: 08/05/2012  12:35 PM           To: Mardella Layman, MD            Onalee Hua, although her anemia is stable: Is quite profound. Could you expedite an office visit with you. Thank you

## 2012-08-06 ENCOUNTER — Ambulatory Visit
Admission: RE | Admit: 2012-08-06 | Discharge: 2012-08-06 | Disposition: A | Discharge status: Home / Self Care | Payer: Medicare Other | Source: Ambulatory Visit | Attending: Radiology | Admitting: Radiology

## 2012-08-06 ENCOUNTER — Ambulatory Visit
Admission: RE | Admit: 2012-08-06 | Discharge: 2012-08-06 | Disposition: A | Discharge status: Home / Self Care | Payer: Medicare Other | Source: Ambulatory Visit | Attending: Internal Medicine | Admitting: Internal Medicine

## 2012-08-06 DIAGNOSIS — Z1231 Encounter for screening mammogram for malignant neoplasm of breast: Secondary | ICD-10-CM | POA: Diagnosis not present

## 2012-08-06 DIAGNOSIS — N959 Unspecified menopausal and perimenopausal disorder: Secondary | ICD-10-CM

## 2012-08-06 DIAGNOSIS — M810 Age-related osteoporosis without current pathological fracture: Secondary | ICD-10-CM | POA: Diagnosis not present

## 2012-08-06 DIAGNOSIS — Z780 Asymptomatic menopausal state: Secondary | ICD-10-CM | POA: Diagnosis not present

## 2012-08-07 ENCOUNTER — Ambulatory Visit (INDEPENDENT_AMBULATORY_CARE_PROVIDER_SITE_OTHER): Payer: Medicare Other | Admitting: Nurse Practitioner

## 2012-08-07 ENCOUNTER — Ambulatory Visit: Payer: Medicare Other

## 2012-08-07 ENCOUNTER — Encounter: Payer: Self-pay | Admitting: Nurse Practitioner

## 2012-08-07 VITALS — BP 120/60 | HR 78 | Ht 59.0 in | Wt 156.0 lb

## 2012-08-07 DIAGNOSIS — D649 Anemia, unspecified: Secondary | ICD-10-CM | POA: Diagnosis not present

## 2012-08-07 DIAGNOSIS — E538 Deficiency of other specified B group vitamins: Secondary | ICD-10-CM

## 2012-08-07 DIAGNOSIS — D509 Iron deficiency anemia, unspecified: Secondary | ICD-10-CM | POA: Diagnosis not present

## 2012-08-07 MED ORDER — MOVIPREP 100 G PO SOLR: 1.0000 | Freq: Once | ORAL | Status: AC

## 2012-08-07 MED ORDER — CYANOCOBALAMIN 1000 MCG/ML IJ SOLN
1000.0000 ug | Freq: Once | INTRAMUSCULAR | Status: AC
  Administered 2012-08-07: 1000 ug via INTRAMUSCULAR

## 2012-08-07 NOTE — Patient Instructions (Addendum)
We sent a prescription for the colonoscopy prep to [facility], [address]. We will call you once we hear from Dr. Alwyn Ren regarding the Plavix medication . He may want you to hold the Plavix for  5 days before the procedures.  Colonoscopy A colonoscopy is an exam to evaluate your entire colon. In this exam, your colon is cleansed. A long fiberoptic tube is inserted through your rectum and into your colon. The fiberoptic scope (endoscope) is a long bundle of enclosed and very flexible fibers. These fibers transmit light to the area examined and send images from that area to your caregiver. Discomfort is usually minimal. You may be given a drug to help you sleep (sedative) during or prior to the procedure. This exam helps to detect lumps (tumors), polyps, inflammation, and areas of bleeding. Your caregiver may also take a small piece of tissue (biopsy) that will be examined under a microscope. LET YOUR CAREGIVER KNOW ABOUT:   Allergies to food or medicine.   Medicines taken, including vitamins, herbs, eyedrops, over-the-counter medicines, and creams.   Use of steroids (by mouth or creams).   Previous problems with anesthetics or numbing medicines.   History of bleeding problems or blood clots.   Previous surgery.   Other health problems, including diabetes and kidney problems.   Possibility of pregnancy, if this applies.  BEFORE THE PROCEDURE   A clear liquid diet may be required for 2 days before the exam.   Ask your caregiver about changing or stopping your regular medications.   Liquid injections (enemas) or laxatives may be required.   A large amount of electrolyte solution may be given to you to drink over a short period of time. This solution is used to clean out your colon.   You should be present 60 minutes prior to your procedure or as directed by your caregiver.  AFTER THE PROCEDURE   If you received a sedative or pain relieving medication, you will need to arrange for  someone to drive you home.   Occasionally, there is a little blood passed with the first bowel movement. Do not be concerned.  FINDING OUT THE RESULTS OF YOUR TEST Not all test results are available during your visit. If your test results are not back during the visit, make an appointment with your caregiver to find out the results. Do not assume everything is normal if you have not heard from your caregiver or the medical facility. It is important for you to follow up on all of your test results. HOME CARE INSTRUCTIONS   It is not unusual to pass moderate amounts of gas and experience mild abdominal cramping following the procedure. This is due to air being used to inflate your colon during the exam. Walking or a warm pack on your belly (abdomen) may help.   You may resume all normal meals and activities after sedatives and medicines have worn off.   Only take over-the-counter or prescription medicines for pain, discomfort, or fever as directed by your caregiver. Do not use aspirin or blood thinners if a biopsy was taken. Consult your caregiver for medicine usage if biopsies were taken.  SEEK IMMEDIATE MEDICAL CARE IF:   You have a fever.   You pass large blood clots or fill a toilet with blood following the procedure. This may also occur 10 to 14 days following the procedure. This is more likely if a biopsy was taken.   You develop abdominal pain that keeps getting worse and cannot  be relieved with medicine.  Document Released: 11/09/2000 Document Revised: 11/01/2011 Document Reviewed: 06/24/2008 [facility] Patient Information 2012 [place], [place].

## 2012-08-07 NOTE — Progress Notes (Addendum)
08/07/2012 Toni Hayes [id number] March 23, 1924   HISTORY OF PRESENT ILLNESS: Patient is an 78 year old female referred here for evaluation of anemia. Patient known to Dr. Jarold Motto for history of adenomatous colon polyps (2006) and a [facility] of colon cancer. She also has a history of an esophageal stricture s/p dilation 2006. Her last colonoscopy was February 2010 with findings of severe diverticulosis, a small cecal polyp and internal hemorrhoids. Polyp retrieval was not successful. She is worked in today at the request of PCP for evaluation of anemia. Her baseline hemoglobin is around 14 but that was in 2010. On August 30 it was found to be 8.1, repeat hemoglobin 07/31/12 was again 8.1. Her MCV is low at 64. Iron studies compatible with iron deficiency anemia with a ferritin of 3. She also has vitamin B12 deficiency, B12 149 last week. Her only blood work in between February 2010 and now was done in [facility] August of last year at the time of her hysterectomy for uterine cancer. Patient does not recall being told she was anemic during that hospitalization, she did not require any blood transfusions.  Of note, hemoccult negative x3 on 08/05/12. No weight loss, no abdominal pain, no nausea. No GERD symptoms.Patient was started on Zantac 2 days ago ( ?empirically for gastric mucosal protection) Past Medical History  Diagnosis Date  . B12 deficiency   . Malignant neoplasm of uterus   . Hyperlipemia   . Carpal tunnel syndrome, right   . HTN (hypertension)     Essential NOS  . Esophageal stricture   . GERD (gastroesophageal reflux disease)   . Hemorrhage of rectum and anus   . Back pain, thoracic     region  . Disorder of bone and cartilage   . Sleep disorder   . Urinary frequency   . Circulatory disease     Personal Hx of unspecified  . Hx of colonic polyps   . Abnormal ECG    Past Surgical History  Procedure Date  . Cholecystectomy   . Breast lumpectomy   . Esophageal dilation 2002  .  Colonoscopy w/ biopsies and polypectomy   . Esophageal dilation 2006    Dr Jarold Motto  . Ankle fracture surgery   . Wrist fracture surgery     left arm  . Total abdominal hysterectomy w/ bilateral salpingoophorectomy 2011    Dr Ruthe Mannan; [facility]    reports that she quit smoking about 39 years ago. She has never used smokeless tobacco. She reports that she does not drink alcohol or use illicit drugs. family history includes Breast cancer in her mother; Colon cancer in her son; Diabetes in her brother; Heart attack in her brother; Liver cancer in her brother; Lung cancer in her brother; and Stroke in her father. Allergies  Allergen Reactions  . Codeine     rash      Outpatient Encounter Prescriptions as of 08/07/2012  Medication Sig Dispense Refill  . Acetaminophen (TYLENOL PO) Take by mouth.      . AMBULATORY NON FORMULARY MEDICATION Medication Name: [employer] Flex Support: once daily      . amLODipine (NORVASC) 5 MG tablet TAKE 1 TABLET EVERY DAY  30 tablet  5  . aspirin 81 MG tablet Take 81 mg by mouth daily.      . Calcium Carbonate (CALCIUM 500 PO) Take 1,200 mg by mouth 2 (two) times daily.      . camphor-menthol (SARNA) lotion Apply 1 application topically as needed.      Marland Kitchen  cetaphil (CETAPHIL) lotion Apply 1 application topically as needed.      . Cholecalciferol (VITAMIN D-400 PO) Take by mouth daily.       Tery Sanfilippo Calcium (STOOL SOFTENER PO) Take by mouth as needed.      . gabapentin (NEURONTIN) 100 MG capsule Take 1 capsule (100 mg total) by mouth 3 (three) times daily.  30 capsule  2  . hydrOXYzine (VISTARIL) 25 MG capsule Take 25 mg by mouth daily as needed.      . Multiple Vitamin (MULTIVITAMIN) tablet Take 1 tablet by mouth daily.      Marland Kitchen Phenyleph-Shark Liv Oil-MO-Pet (PREP-HEM RE) Place rectally as directed.      Marland Kitchen PLAVIX 75 MG tablet TAKE 1 TABLET BY MOUTH EVERY DAY  90 tablet  1  . pravastatin (PRAVACHOL) 20 MG tablet TAKE ONE TABLET BY MOUTH EVERY DAY AT BEDTIME   30 tablet  0  . Probiotic Product (PROBIOTIC DAILY PO) Take by mouth daily.      . ranitidine (ZANTAC) 150 MG tablet Take 1 tablet (150 mg total) by mouth 2 (two) times daily.  60 tablet  2     REVIEW OF SYSTEMS  : Positive for back pain, itching, shortness of breath and swelling of feet. All other systems reviewed and negative except where noted in the History of Present Illness.   PHYSICAL EXAM: BP 120/60  Pulse 78  Ht 4\' 11"  (1.499 m)  Wt 156 lb (70.761 kg)  BMI 31.51 kg/m2 General: Well developed white female in no acute distress Head: Normocephalic and atraumatic Eyes:  sclerae anicteric,conjunctive pink. Ears: Normal auditory acuity Mouth: No deformity or lesions Neck: Supple, no masses.  Lungs: Clear throughout to auscultation Heart: Regular rate and rhythm Abdomen: Soft, non distended, nontender. No masses or hepatomegaly noted. Normal Bowel sounds Musculoskeletal: Symmetrical with no gross deformities  Skin: No lesions on visible extremities Extremities: No edema or deformities noted Neurological: Alert oriented x 4, grossly nonfocal Cervical Nodes:  No significant cervical adenopathy Psychological:  Alert and cooperative. Normal mood and affect  ASSESSMENT AND PLAN:  1. Anemia, Iron deficiency (ferritin 3.9) in setting of ASA and Plavix.  Her B12 deficiency likely contributing to anemia as well, see  #3. No overt GI blood loss, hemoccult negative x3. Her last colonoscopy for [facility] of colon cancer was Feb 2010 (this is an addendum to my original note in which I stated last colonoscopy was 2013). Doubt colon neoplasm but need to exclude for sure. In addition, patient will be scheduled for upper endoscopy for evaluation of anemia. If these studies are negative patient may require capsule endo. We will contact Dr. Alwyn Ren about whether Plavix can be held for procedures. .The risks, benefits, and alternatives to EGD as well as colonoscopy with possible biopsy and possible  polypectomy were discussed with the patient and she consents to proceed.  2. B12 deficiency. This could be contributing to anemia. Patient to begin B12 injections with PCP.    3. .history of uterine cancer, status post hysterectomy one year ago at [facility]   4. history of TIA, on low-dose aspirin and Plavix.  5. strong family history of colon cancer in brother and son, see #1.

## 2012-08-10 ENCOUNTER — Encounter: Payer: Self-pay | Admitting: Nurse Practitioner

## 2012-08-11 NOTE — Progress Notes (Signed)
Agree with initial assessment and plans for EGD to evaluate new and significant iron deficiency anemia. Given hx TIA, would keep on asa / plavix for diagnostic EGD and possble CE (to be determined by Dr. Jarold Motto

## 2012-08-14 ENCOUNTER — Ambulatory Visit (INDEPENDENT_AMBULATORY_CARE_PROVIDER_SITE_OTHER): Payer: Medicare Other | Admitting: Internal Medicine

## 2012-08-14 DIAGNOSIS — E538 Deficiency of other specified B group vitamins: Secondary | ICD-10-CM | POA: Diagnosis not present

## 2012-08-14 MED ORDER — CYANOCOBALAMIN 1000 MCG/ML IJ SOLN
1000.0000 ug | Freq: Once | INTRAMUSCULAR | Status: AC
  Administered 2012-08-14: 1000 ug via INTRAMUSCULAR

## 2012-08-19 ENCOUNTER — Encounter: Payer: Self-pay | Admitting: Internal Medicine

## 2012-08-20 ENCOUNTER — Ambulatory Visit (INDEPENDENT_AMBULATORY_CARE_PROVIDER_SITE_OTHER): Payer: Medicare Other | Admitting: Gastroenterology

## 2012-08-20 ENCOUNTER — Encounter: Payer: Self-pay | Admitting: Gastroenterology

## 2012-08-20 VITALS — BP 136/85 | HR 65 | Temp 96.8°F | Resp 18 | Ht 59.0 in | Wt 156.0 lb

## 2012-08-20 DIAGNOSIS — D649 Anemia, unspecified: Secondary | ICD-10-CM

## 2012-08-20 DIAGNOSIS — Z860100 Personal history of colon polyps, unspecified: Secondary | ICD-10-CM

## 2012-08-20 DIAGNOSIS — K259 Gastric ulcer, unspecified as acute or chronic, without hemorrhage or perforation: Secondary | ICD-10-CM

## 2012-08-20 DIAGNOSIS — K222 Esophageal obstruction: Secondary | ICD-10-CM | POA: Diagnosis not present

## 2012-08-20 DIAGNOSIS — K219 Gastro-esophageal reflux disease without esophagitis: Secondary | ICD-10-CM

## 2012-08-20 DIAGNOSIS — D509 Iron deficiency anemia, unspecified: Secondary | ICD-10-CM | POA: Diagnosis not present

## 2012-08-20 DIAGNOSIS — K449 Diaphragmatic hernia without obstruction or gangrene: Secondary | ICD-10-CM

## 2012-08-20 HISTORY — PX: UPPER GASTROINTESTINAL ENDOSCOPY: SHX188

## 2012-08-20 HISTORY — PX: COLONOSCOPY: SHX174

## 2012-08-20 MED ORDER — FERROUS FUM-IRON POLYSACCH 162-115.2 MG PO CAPS: 1.0000 | ORAL_CAPSULE | Freq: Every day | ORAL | Status: DC

## 2012-08-20 MED ORDER — ESOMEPRAZOLE MAGNESIUM 40 MG PO CPDR: 40.0000 mg | DELAYED_RELEASE_CAPSULE | Freq: Every day | ORAL | Status: DC

## 2012-08-20 MED ORDER — SODIUM CHLORIDE 0.9 % IV SOLN: 500.0000 mL | INTRAVENOUS | Status: DC

## 2012-08-20 NOTE — Op Note (Signed)
[facility] [address] [place], [address]   COLONOSCOPY PROCEDURE REPORT  PATIENT: Toni, Hayes  MR#: [id number] BIRTHDATE: 12-01-1923 , 87  yrs. old GENDER: Female ENDOSCOPIST: Mardella Layman, MD, Clementeen Graham REFERRED BY:  Marga Melnick, M.D. PROCEDURE DATE:  08/20/2012 PROCEDURE:   Colonoscopy, diagnostic ASA CLASS:   Class III INDICATIONS:iron deficiency anemia, constipation, and patient's personal history of colon polyps. MEDICATIONS: propofol (Diprivan) 200mg  IV  DESCRIPTION OF PROCEDURE:   After the risks and benefits and of the procedure were explained, informed consent was obtained.  A digital rectal exam revealed no abnormalities of the rectum.    The LB PCF-H180AL [id number]  endoscope was introduced through the anus and advanced to the cecum, which was identified by both the appendix and ileocecal valve .  The quality of the prep was adequate, using MoviPrep .  The instrument was then slowly withdrawn as the colon was fully examined.     COLON FINDINGS: The colon was redundant.  Manual abdominal counter-pressure was used to reach the cecum.  The patient was moved on to their right side to reach the cecum.  The patient was moved on to their back to reach the cecum.   Severe melanosis coli noted.   Moderate diverticulosis was noted throughout the entire examined colon.     Retroflexed views revealed no abnormalities. The scope was then withdrawn from the patient and the procedure completed.  COMPLICATIONS: There were no complications. ENDOSCOPIC IMPRESSION: 1.   The colon was redundant ...extremely difficult exam...no heme noted... 2.   Severe melanosis coli noted 3.   Moderate diverticulosis was noted throughout the entire examined colon  RECOMMENDATIONS: 1.  continue current medications 2.  Upper endoscopy will be scheduled   REPEAT EXAM:  cc:  _______________________________ eSigned:  Mardella Layman, MD, [facility] 08/20/2012 4:03  PM     PATIENT NAME:  Toni, Hayes MR#: [id number]

## 2012-08-20 NOTE — Op Note (Signed)
[facility] [address] [place], [address]   ENDOSCOPY PROCEDURE REPORT  PATIENT: Toni Hayes, Toni Hayes  MR#: [id number] BIRTHDATE: 06-25-1924 , 87  yrs. old GENDER: Female ENDOSCOPIST:Moustafa Mossa Hale Bogus, MD, Clementeen Graham REFERRED BY: Marga Melnick, M.D. PROCEDURE DATE:  08/20/2012 PROCEDURE:   EGD, diagnostic ASA CLASS:    Class III INDICATIONS: iron deficiency anemia. MEDICATION: There was residual sedation effect present from prior procedure and Propofol (Diprivan) 60 mg IV TOPICAL ANESTHETIC:  DESCRIPTION OF PROCEDURE:   After the risks and benefits of the procedure were explained, informed consent was obtained.  The LB GIF-H180 [id number]  endoscope was introduced through the mouth  and advanced to the second portion of the duodenum .  The instrument was slowly withdrawn as the mucosa was fully examined.      ESOPHAGUS: The mucosa of the esophagus appeared normal.   A large hiatal hernia was noted.  1/2 HER STOMACH IN HER CHEST...10 CM.HH AND DRIED HEME IN LINEAR CAMERON EROSIONS !!!!.   1/2 HER STOMACH IN HER CHEST...10 CM.HH AND DRIED HEME IN LINEAR CAMERON EROSIONS !!!!.  DUODENUM: The duodenal mucosa showed no abnormalities. Retroflexed views revealed a hiatal hernia.    The scope was then withdrawn from the patient and the procedure completed.  COMPLICATIONS: There were no complications.   ENDOSCOPIC IMPRESSION: 1.   The mucosa of the esophagus appeared normal 2.   Large hiatal hernia 3.   1/2 HER STOMACH IN HER CHEST...10 CM.HH AND DRIED HEME IN LINEAR CAMERON EROSIONS !!!! THIS IS THE SITE OF HER BLEEDING. 4.   The duodenal mucosa showed no abnormalities  RECOMMENDATIONS: 1.  continue current medications 2.  continue PPI 3.  anti-reflux regimen to be follow    _______________________________ eSigned:  Mardella Layman, MD, [facility] 08/20/2012 4:11 PM   antireflux   PATIENT NAME:  Narcissus, Detwiler MR#: [id number]

## 2012-08-20 NOTE — Patient Instructions (Addendum)
Discharge instructions given with verbal understanding. Handouts on a hiatal hernia, diverticulosis and a high fiber diet given. Resume previous medications. YOU HAD AN ENDOSCOPIC PROCEDURE TODAY AT THE [facility]: Refer to the procedure report that was given to you for any specific questions about what was found during the examination.  If the procedure report does not answer your questions, please call your gastroenterologist to clarify.  If you requested that your care partner not be given the details of your procedure findings, then the procedure report has been included in a sealed envelope for you to review at your convenience later.  YOU SHOULD EXPECT: Some feelings of bloating in the abdomen. Passage of more gas than usual.  Walking can help get rid of the air that was put into your GI tract during the procedure and reduce the bloating. If you had a lower endoscopy (such as a colonoscopy or flexible sigmoidoscopy) you may notice spotting of blood in your stool or on the toilet paper. If you underwent a bowel prep for your procedure, then you may not have a normal bowel movement for a few days.  DIET: Your first meal following the procedure should be a light meal and then it is ok to progress to your normal diet.  A half-sandwich or bowl of soup is an example of a good first meal.  Heavy or fried foods are harder to digest and may make you feel nauseous or bloated.  Likewise meals heavy in dairy and vegetables can cause extra gas to form and this can also increase the bloating.  Drink plenty of fluids but you should avoid alcoholic beverages for 24 hours.  ACTIVITY: Your care partner should take you home directly after the procedure.  You should plan to take it easy, moving slowly for the rest of the day.  You can resume normal activity the day after the procedure however you should NOT DRIVE or use heavy machinery for 24 hours (because of the sedation medicines used during the test).      SYMPTOMS TO REPORT IMMEDIATELY: A gastroenterologist can be reached at any hour.  During normal business hours, 8:30 AM to 5:00 PM Monday through Friday, call [phone].  After hours and on weekends, please call the GI answering service at [phone] who will take a message and have the physician on call contact you.   Following lower endoscopy (colonoscopy or flexible sigmoidoscopy):  Excessive amounts of blood in the stool  Significant tenderness or worsening of abdominal pains  Swelling of the abdomen that is new, acute  Fever of 100F or higher  Following upper endoscopy (EGD)  Vomiting of blood or coffee ground material  New chest pain or pain under the shoulder blades  Painful or persistently difficult swallowing  New shortness of breath  Fever of 100F or higher  Black, tarry-looking stools  FOLLOW UP: If any biopsies were taken you will be contacted by phone or by letter within the next 1-3 weeks.  Call your gastroenterologist if you have not heard about the biopsies in 3 weeks.  Our staff will call the home number listed on your records the next business day following your procedure to check on you and address any questions or concerns that you may have at that time regarding the information given to you following your procedure. This is a courtesy call and so if there is no answer at the home number and we have not heard from you through the emergency physician   on call, we will assume that you have returned to your regular daily activities without incident.  SIGNATURES/CONFIDENTIALITY: You and/or your care partner have signed paperwork which will be entered into your electronic medical record.  These signatures attest to the fact that that the information above on your After Visit Summary has been reviewed and is understood.  Full responsibility of the confidentiality of this discharge information lies with you and/or your care-partner.  

## 2012-08-20 NOTE — Progress Notes (Signed)
Patient did not experience any of the following events: a burn prior to discharge; a fall within the facility; wrong site/side/patient/procedure/implant event; or a hospital transfer or hospital admission upon discharge from the facility. (G8907) Patient did not have preoperative order for IV antibiotic SSI prophylaxis. (G8918)  

## 2012-08-21 ENCOUNTER — Telehealth: Payer: Self-pay | Admitting: *Deleted

## 2012-08-21 ENCOUNTER — Ambulatory Visit (INDEPENDENT_AMBULATORY_CARE_PROVIDER_SITE_OTHER): Payer: Medicare Other

## 2012-08-21 DIAGNOSIS — Z23 Encounter for immunization: Secondary | ICD-10-CM | POA: Diagnosis not present

## 2012-08-21 NOTE — Telephone Encounter (Signed)
  Follow up Call-  Call back number 08/20/2012  Post procedure Call Back phone  # [phone]  Permission to leave phone message Yes     Patient questions:  Do you have a fever, pain , or abdominal swelling? no Pain Score  0 *  Have you tolerated food without any problems? yes  Have you been able to return to your normal activities? yes  Do you have any questions about your discharge instructions: Diet   no Medications  no Follow up visit  no  Do you have questions or concerns about your Care? no  Actions: * If pain score is 4 or above: No action needed, pain <4.  Information provided via daughter.

## 2012-08-22 ENCOUNTER — Ambulatory Visit (INDEPENDENT_AMBULATORY_CARE_PROVIDER_SITE_OTHER): Payer: Medicare Other | Admitting: Internal Medicine

## 2012-08-22 DIAGNOSIS — E538 Deficiency of other specified B group vitamins: Secondary | ICD-10-CM

## 2012-08-22 MED ORDER — CYANOCOBALAMIN 1000 MCG/ML IJ SOLN: 1000.0000 ug | Freq: Once | INTRAMUSCULAR | Status: DC

## 2012-08-22 MED ORDER — CYANOCOBALAMIN 1000 MCG/ML IJ SOLN
1000.0000 ug | Freq: Once | INTRAMUSCULAR | Status: DC
  Administered 2012-08-22: 1000 ug via INTRAMUSCULAR

## 2012-08-30 ENCOUNTER — Other Ambulatory Visit: Payer: Self-pay | Admitting: Internal Medicine

## 2012-09-01 NOTE — Telephone Encounter (Signed)
Please advise last OV: 07/31/12, last filled 06/14/11 #90/1

## 2012-09-01 NOTE — Telephone Encounter (Signed)
Plavix should be held because of the gastric erosions with dramatic anemia. Neurology  reevaluation is indicated to determine whether Plavix is indicated in view of GI   bleeding disorder.

## 2012-09-03 ENCOUNTER — Ambulatory Visit: Payer: Medicare Other | Admitting: Gastroenterology

## 2012-09-05 ENCOUNTER — Encounter: Payer: Self-pay | Admitting: Internal Medicine

## 2012-09-05 ENCOUNTER — Ambulatory Visit (INDEPENDENT_AMBULATORY_CARE_PROVIDER_SITE_OTHER): Payer: Medicare Other | Admitting: Internal Medicine

## 2012-09-05 VITALS — BP 118/70 | HR 80 | Temp 98.1°F | Resp 20 | Ht 60.5 in | Wt 158.0 lb

## 2012-09-05 DIAGNOSIS — K449 Diaphragmatic hernia without obstruction or gangrene: Secondary | ICD-10-CM | POA: Insufficient documentation

## 2012-09-05 DIAGNOSIS — Z8673 Personal history of transient ischemic attack (TIA), and cerebral infarction without residual deficits: Secondary | ICD-10-CM

## 2012-09-05 DIAGNOSIS — D649 Anemia, unspecified: Secondary | ICD-10-CM

## 2012-09-05 LAB — CBC WITH DIFFERENTIAL/PLATELET
Basophils Relative: 0.8 % (ref 0.0–3.0)
Eosinophils Relative: 3.1 % (ref 0.0–5.0)
HCT: 30.4 % — ABNORMAL LOW (ref 36.0–46.0)
Hemoglobin: 9.1 g/dL — ABNORMAL LOW (ref 12.0–15.0)
Lymphs Abs: 1.4 10*3/uL (ref 0.7–4.0)
Monocytes Relative: 9.1 % (ref 3.0–12.0)
Neutro Abs: 3 10*3/uL (ref 1.4–7.7)
WBC: 5.1 10*3/uL (ref 4.5–10.5)

## 2012-09-05 NOTE — Progress Notes (Signed)
  Subjective:    Patient ID: Toni Hayes, female    DOB: [birth year], 78 y.o.   MRN: [id number]  HPI Despite the fact that she had profound anemia with a hematocrit of 28.4; she was relatively asymptomatic. The anemia was initially found by her dermatologist.  At endoscopy 08/20/12 she was found to have a large hiatal hernia (10 cm) with half stomach in the thorax. Dried blood was found @ [place] erosions.  She is on iron with dark stool.  Request was received for renewal of her Plavix; I did not renew the medication because of GI issues; she has continued to take a supply she has on hand    Review of Systems She was placed on Plavix in 2004 along with low-dose aspirin because of TIAs. This was evaluated by [facility] neurology, Dr. Orlin Hilding.  She denies significant headaches, extremity numbness or tingling, extremity weakness, speech dysfunction, or gait issues     Objective:   Physical Exam General appearance is one of good health and nourishment w/o distress.  Eyes: No conjunctival pallor or scleral icterus is present.  Oral exam: Dental hygiene is good; lips and gums are healthy appearing.There is no oropharyngeal erythema or exudate noted.   Heart:  Normal rate and regular rhythm. S1 and S2 normal without gallop, murmur, click, rub or other extra sounds     Lungs:Chest clear to auscultation; no wheezes, rhonchi,rales ,or rubs present.No increased work of breathing.   Abdomen: bowel sounds normal, soft and non-tender without masses, organomegaly or hernias noted.  No guarding or rebound   Skin:Warm & dry.  Intact without suspicious lesions or rashes ; no jaundice or tenting  Lymphatic: No lymphadenopathy is noted about the head, neck, axilla areas .  Neuro: Deep tendon reflexes equal and normal. Strength and tone good. Alert and oriented x3        Assessment & Plan:

## 2012-09-05 NOTE — Assessment & Plan Note (Signed)
CBC and differential will be checked

## 2012-09-05 NOTE — Assessment & Plan Note (Signed)
Baby coated aspirin with with breakfast will be continued until neurology followup. Plavix will be held

## 2012-09-05 NOTE — Patient Instructions (Addendum)
Please take enteric-coated aspirin 81 mg daily with breakfast.  Review and correct the record as indicated. Please share record with all medical staff seen.  If you activate My Chart; the results can be released to you as soon as they populate from the lab. If you choose not to use this program; the labs have to be reviewed, copied & mailed   causing a delay in getting the results to you.

## 2012-10-02 ENCOUNTER — Ambulatory Visit (INDEPENDENT_AMBULATORY_CARE_PROVIDER_SITE_OTHER): Payer: Medicare Other | Admitting: Gastroenterology

## 2012-10-02 ENCOUNTER — Encounter: Payer: Self-pay | Admitting: Gastroenterology

## 2012-10-02 VITALS — BP 110/66 | HR 72 | Ht 59.0 in | Wt 157.4 lb

## 2012-10-02 DIAGNOSIS — K449 Diaphragmatic hernia without obstruction or gangrene: Secondary | ICD-10-CM

## 2012-10-02 DIAGNOSIS — D649 Anemia, unspecified: Secondary | ICD-10-CM | POA: Diagnosis not present

## 2012-10-02 DIAGNOSIS — E538 Deficiency of other specified B group vitamins: Secondary | ICD-10-CM

## 2012-10-02 DIAGNOSIS — K219 Gastro-esophageal reflux disease without esophagitis: Secondary | ICD-10-CM

## 2012-10-02 MED ORDER — OMEPRAZOLE 40 MG PO CPDR: 40.0000 mg | DELAYED_RELEASE_CAPSULE | Freq: Every day | ORAL | Status: DC

## 2012-10-02 NOTE — Patient Instructions (Signed)
Your physician has requested that you go to the basement in two months(12-02-2012) for lab work: CBC and Iron panel. Lab is open Monday-Friday 7:30am-5:30pm  Please stop all anti-acid medications and start taking only Omeprazole 40 mg. Take one capsule by mouth 30 minutes before breakfast. Prescription was sent to your pharmacy

## 2012-10-02 NOTE — Progress Notes (Signed)
This is a 78 year old Caucasian female evaluated for iron deficiency anemia. Colonoscopy was unremarkable. Endoscopy revealed a large hiatal hernia with Sheria Lang erosions, and this was felt to be the etiology of her iron deficiency. She currently is on daily PPI therapy and is asymptomatic. Patient is on iron by mouth and also has received B12 parenteral replacement. She denies a current GI symptoms. Surprisingly, she denies chronic GERD, chest pain, or any symptoms of gastric torsion. She also denies any hepatobiliary or lower gastrointestinal complaints at this time.  Current Medications, Allergies, Past Medical History, Past Surgical History, Family History and Social History were reviewed in [employer] record.  Pertinent Review of Systems Negative   Physical Exam: Blood pressure 110/66, pulse 72 and regular, weight 157 pounds with a BMI of 31.79. Abdominal exam shows no organomegaly, masses, or tenderness. Bowel sounds are normal. Chest is clear and she appears to be in a regular rhythm without murmurs gallops or rubs. Mental status is normal.   Assessment and Plan: Large prolapsing hiatal hernia with evidence of linear erosions and dried heme in her stomach at the time of endoscopy. She needs to be on long-term PPI therapy, continued B12 replacement therapy, and I have ordered repeat CBC and iron studies in 2 months time. Other medications as per primary care. She is at elevated risk for possible gastric torsion or development of a paraesophageal hernia because of the large size of her hiatal hernia. I reviewed her chronic reflux regime with her in detail. Encounter Diagnoses  Name Primary?  Marland Kitchen Anemia Yes  . GERD (gastroesophageal reflux disease)

## 2012-10-16 DIAGNOSIS — G459 Transient cerebral ischemic attack, unspecified: Secondary | ICD-10-CM | POA: Diagnosis not present

## 2012-10-16 DIAGNOSIS — I6990 Unspecified sequelae of unspecified cerebrovascular disease: Secondary | ICD-10-CM | POA: Diagnosis not present

## 2012-10-28 DIAGNOSIS — Z90710 Acquired absence of both cervix and uterus: Secondary | ICD-10-CM | POA: Diagnosis not present

## 2012-10-28 DIAGNOSIS — I10 Essential (primary) hypertension: Secondary | ICD-10-CM | POA: Diagnosis not present

## 2012-10-28 DIAGNOSIS — Z8673 Personal history of transient ischemic attack (TIA), and cerebral infarction without residual deficits: Secondary | ICD-10-CM | POA: Diagnosis not present

## 2012-10-28 DIAGNOSIS — Z79899 Other long term (current) drug therapy: Secondary | ICD-10-CM | POA: Diagnosis not present

## 2012-10-28 DIAGNOSIS — C549 Malignant neoplasm of corpus uteri, unspecified: Secondary | ICD-10-CM | POA: Diagnosis not present

## 2012-11-04 DIAGNOSIS — C549 Malignant neoplasm of corpus uteri, unspecified: Secondary | ICD-10-CM | POA: Diagnosis not present

## 2012-11-04 DIAGNOSIS — Z79899 Other long term (current) drug therapy: Secondary | ICD-10-CM | POA: Diagnosis not present

## 2012-11-04 DIAGNOSIS — I10 Essential (primary) hypertension: Secondary | ICD-10-CM | POA: Diagnosis not present

## 2012-11-13 DIAGNOSIS — G459 Transient cerebral ischemic attack, unspecified: Secondary | ICD-10-CM | POA: Diagnosis not present

## 2012-12-02 ENCOUNTER — Other Ambulatory Visit (INDEPENDENT_AMBULATORY_CARE_PROVIDER_SITE_OTHER): Payer: Medicare Other

## 2012-12-02 DIAGNOSIS — D649 Anemia, unspecified: Secondary | ICD-10-CM | POA: Diagnosis not present

## 2012-12-02 LAB — CBC WITH DIFFERENTIAL/PLATELET
Basophils Absolute: 0 10*3/uL (ref 0.0–0.1)
Basophils Relative: 0.5 % (ref 0.0–3.0)
Eosinophils Absolute: 0.2 10*3/uL (ref 0.0–0.7)
HCT: 39.5 % (ref 36.0–46.0)
Hemoglobin: 12.7 g/dL (ref 12.0–15.0)
Lymphs Abs: 2 10*3/uL (ref 0.7–4.0)
MCHC: 32.2 g/dL (ref 30.0–36.0)
MCV: 79.2 fl (ref 78.0–100.0)
Monocytes Absolute: 0.5 10*3/uL (ref 0.1–1.0)
Neutro Abs: 4 10*3/uL (ref 1.4–7.7)
RBC: 4.99 Mil/uL (ref 3.87–5.11)
RDW: 22.8 % — ABNORMAL HIGH (ref 11.5–14.6)

## 2012-12-02 LAB — IBC PANEL: Saturation Ratios: 13 % — ABNORMAL LOW (ref 20.0–50.0)

## 2013-01-24 IMAGING — CR DG THORACIC SPINE 2V
3 series · 3 of 3 positions shown · non-contrast
Comparison: 11/06/2010

CLINICAL DATA: Right back pain

THORACIC SPINE - 2 VIEW

[view not recorded (1 of 3)]
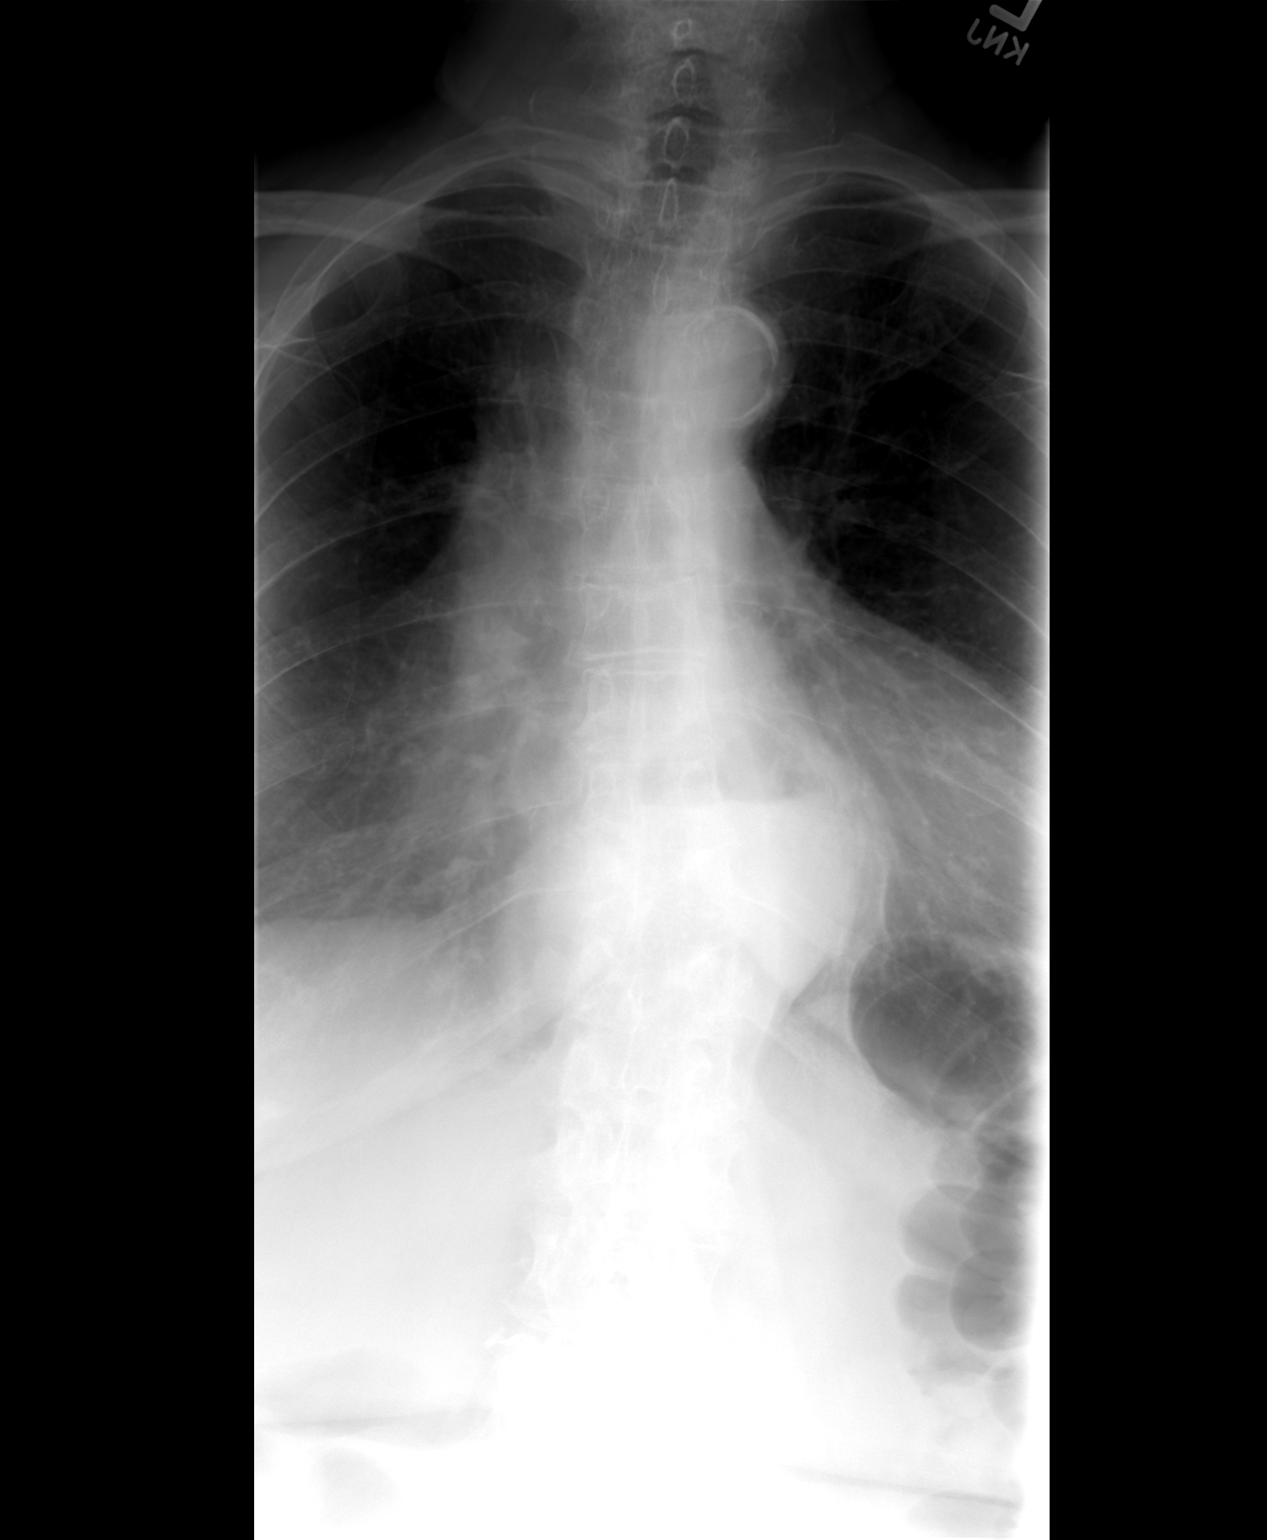

[view not recorded (2 of 3)]
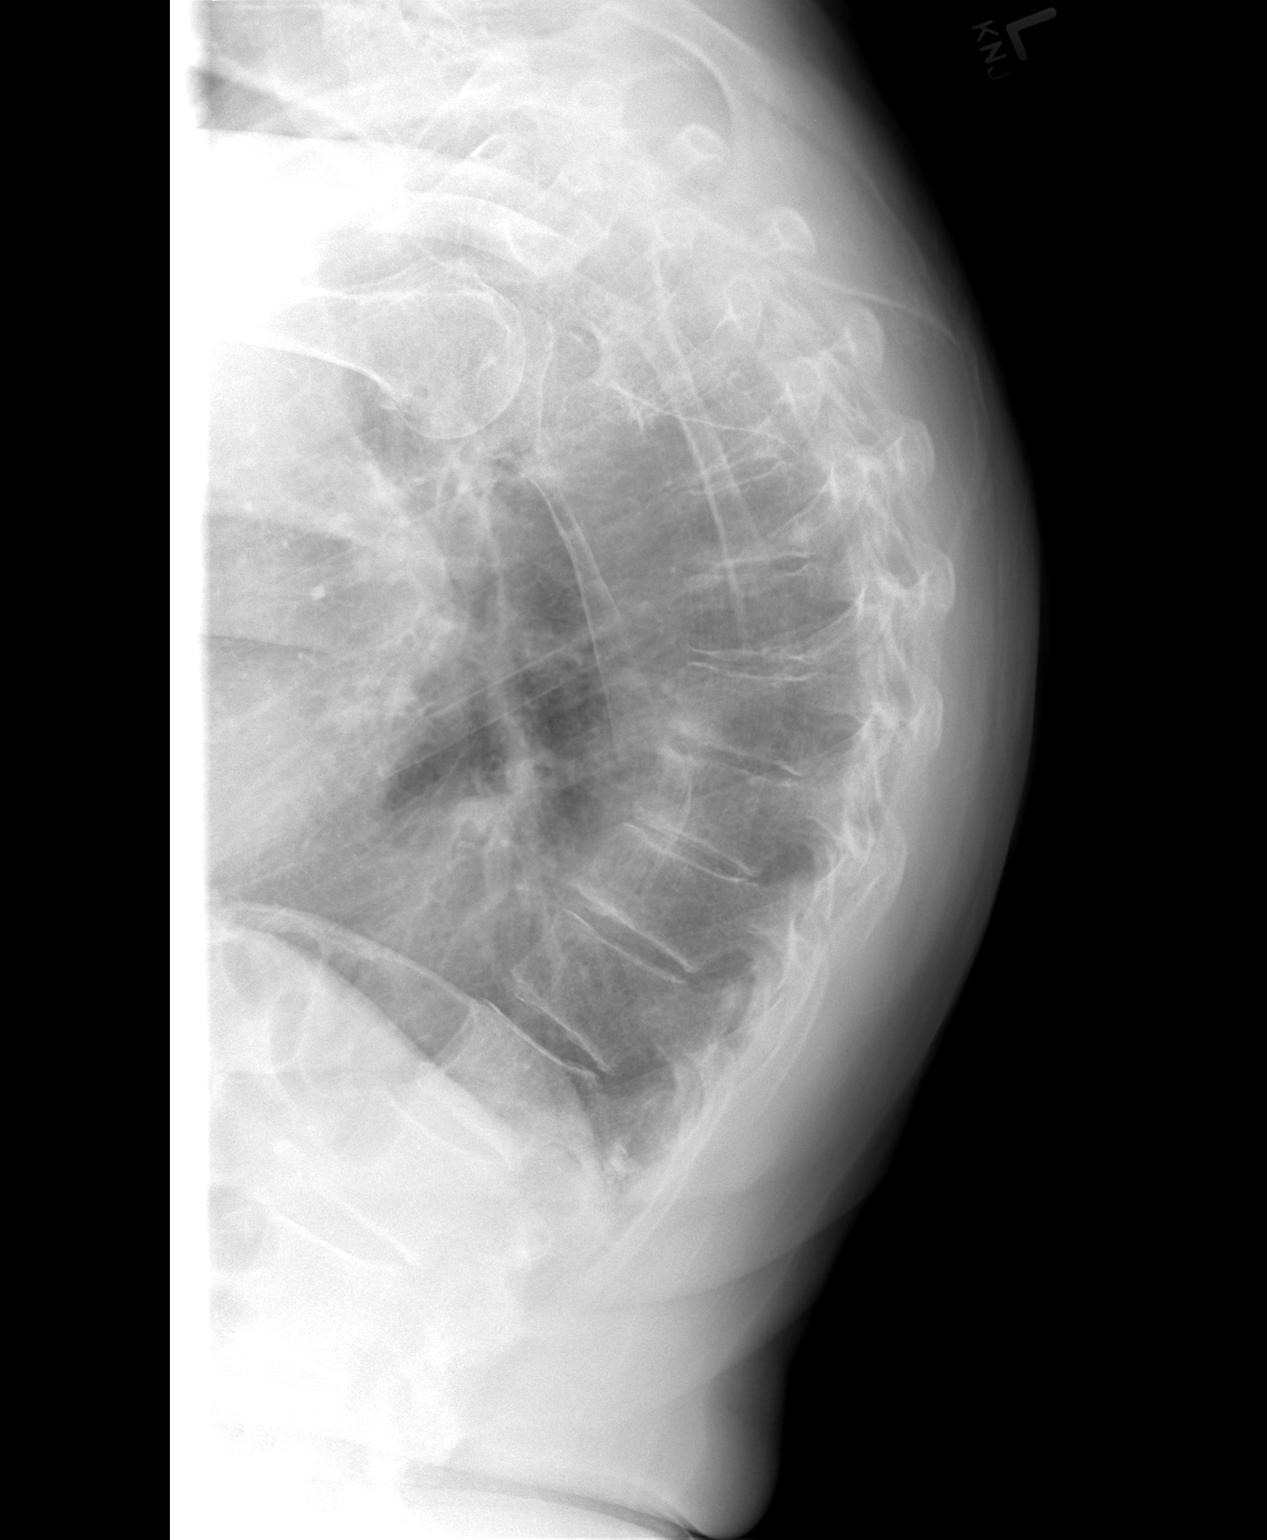

[view not recorded (3 of 3)]
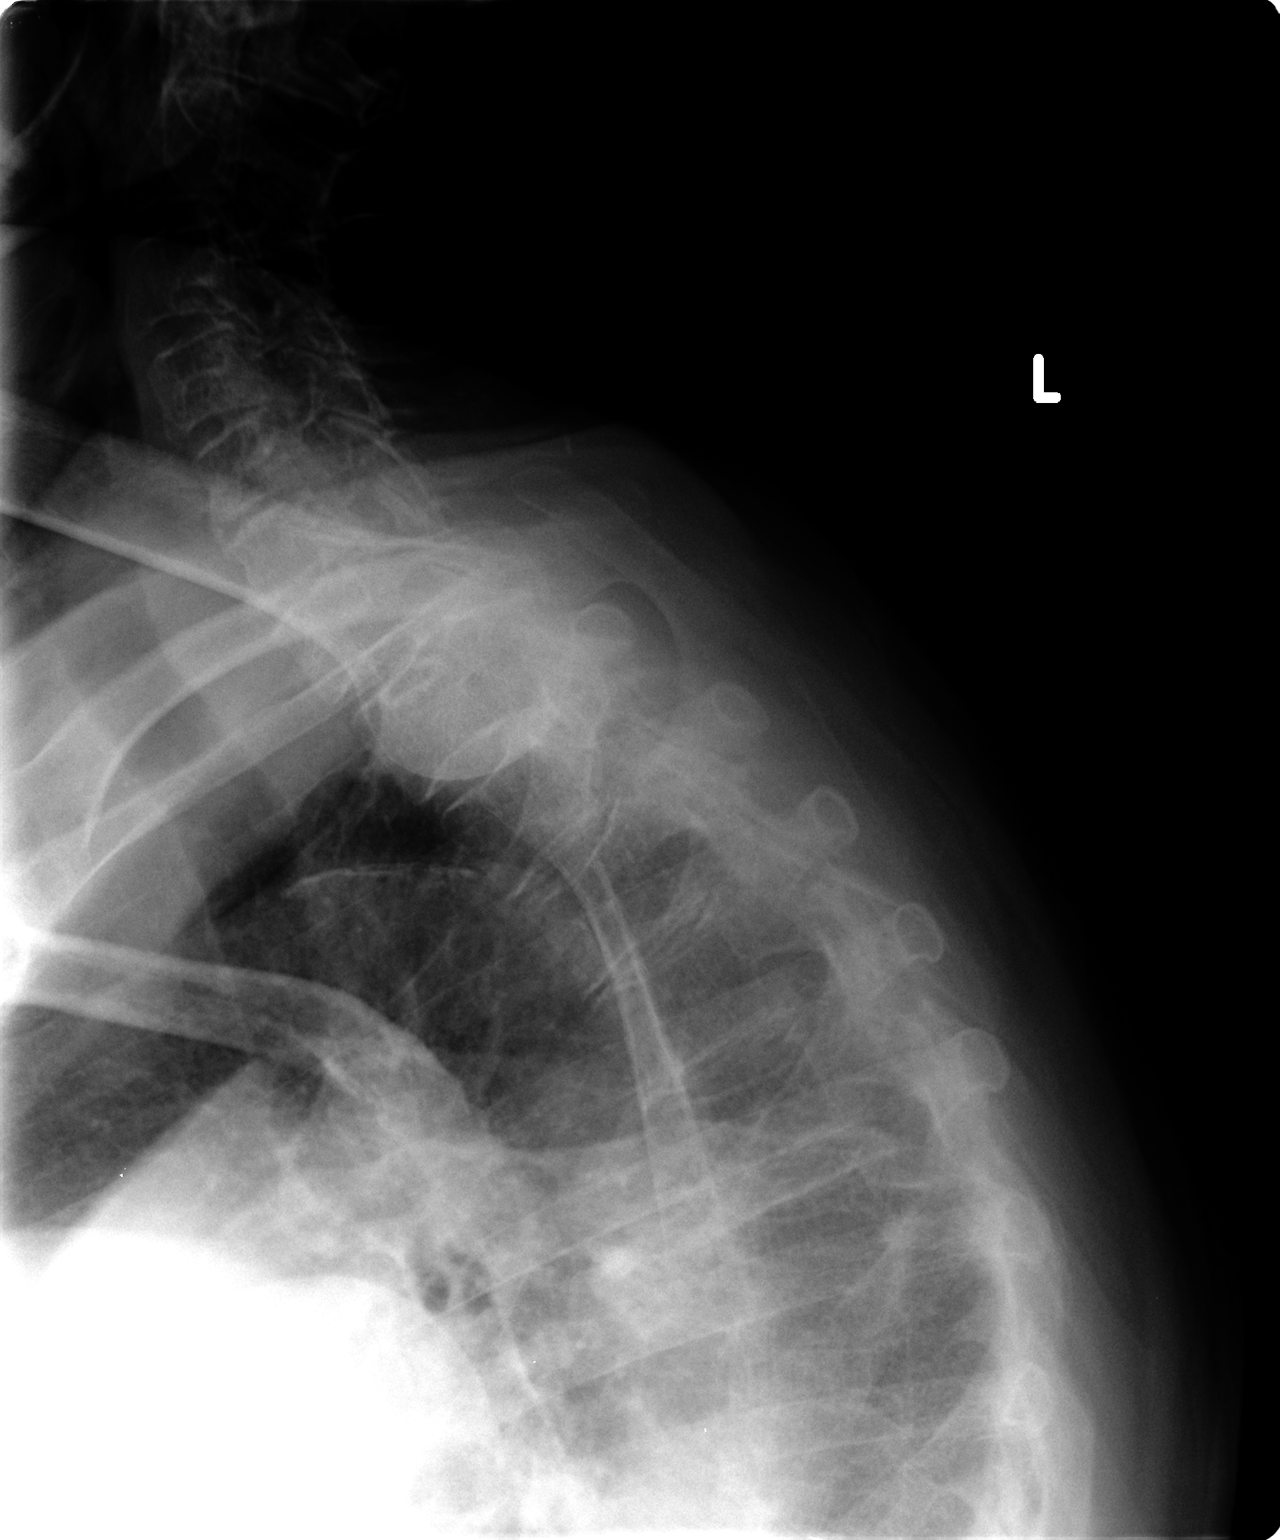

[3 of 3 positions shown; findings below may reference images not displayed]

FINDINGS: There is a mild S-shaped thoracolumbar scoliosis, apex T5
to the right and T11 to the left.  Stable mild spondylitic changes
in the mid thoracic spine.  Negative for interval or   acute
fracture, dislocation, or other acute bony abnormality.
Atheromatous aortic arch.  Vascular clips in the upper abdomen.
IMPRESSION: Stable mild scoliosis and degenerative change.  No acute
abnormality.

## 2013-02-02 DIAGNOSIS — Z8542 Personal history of malignant neoplasm of other parts of uterus: Secondary | ICD-10-CM | POA: Diagnosis not present

## 2013-02-02 DIAGNOSIS — C549 Malignant neoplasm of corpus uteri, unspecified: Secondary | ICD-10-CM | POA: Diagnosis not present

## 2013-02-02 DIAGNOSIS — Z09 Encounter for follow-up examination after completed treatment for conditions other than malignant neoplasm: Secondary | ICD-10-CM | POA: Diagnosis not present

## 2013-02-02 DIAGNOSIS — K449 Diaphragmatic hernia without obstruction or gangrene: Secondary | ICD-10-CM | POA: Diagnosis not present

## 2013-02-02 DIAGNOSIS — Z1272 Encounter for screening for malignant neoplasm of vagina: Secondary | ICD-10-CM | POA: Diagnosis not present

## 2013-02-02 DIAGNOSIS — Z79899 Other long term (current) drug therapy: Secondary | ICD-10-CM | POA: Diagnosis not present

## 2013-02-02 DIAGNOSIS — R911 Solitary pulmonary nodule: Secondary | ICD-10-CM | POA: Diagnosis not present

## 2013-02-02 DIAGNOSIS — J984 Other disorders of lung: Secondary | ICD-10-CM | POA: Diagnosis not present

## 2013-02-06 ENCOUNTER — Other Ambulatory Visit: Payer: Self-pay | Admitting: Internal Medicine

## 2013-02-06 ENCOUNTER — Other Ambulatory Visit: Payer: Self-pay | Admitting: Gastroenterology

## 2013-04-10 ENCOUNTER — Telehealth: Payer: Self-pay | Admitting: *Deleted

## 2013-04-10 MED ORDER — DESOXIMETASONE 0.05 % EX CREA: TOPICAL_CREAM | Freq: Two times a day (BID) | CUTANEOUS | Status: DC

## 2013-04-10 NOTE — Telephone Encounter (Signed)
Med filled. Pt notified.  

## 2013-04-10 NOTE — Telephone Encounter (Signed)
Pt c/o poison ivy on both arms and hands. Pt indicated that it is red and itchy. Pt request a Rx for topicort. Pt notes that her Granddaughter gave her a sample and if help but she is now out. Advise Pt that usually a OV is needed but she would like to see if Dr Alwyn Ren is willing to Rx this without a OV.Toni KitchenPlease advise

## 2013-04-10 NOTE — Telephone Encounter (Signed)
45 grams ; apply bid (not to eyes)

## 2013-05-05 DIAGNOSIS — R911 Solitary pulmonary nodule: Secondary | ICD-10-CM | POA: Diagnosis not present

## 2013-05-05 DIAGNOSIS — C549 Malignant neoplasm of corpus uteri, unspecified: Secondary | ICD-10-CM | POA: Diagnosis not present

## 2013-05-05 DIAGNOSIS — I517 Cardiomegaly: Secondary | ICD-10-CM | POA: Diagnosis not present

## 2013-06-06 DIAGNOSIS — R209 Unspecified disturbances of skin sensation: Secondary | ICD-10-CM | POA: Diagnosis not present

## 2013-06-06 DIAGNOSIS — L259 Unspecified contact dermatitis, unspecified cause: Secondary | ICD-10-CM | POA: Diagnosis not present

## 2013-06-06 DIAGNOSIS — L0291 Cutaneous abscess, unspecified: Secondary | ICD-10-CM | POA: Diagnosis not present

## 2013-06-06 DIAGNOSIS — L0390 Cellulitis, unspecified: Secondary | ICD-10-CM | POA: Diagnosis not present

## 2013-06-06 DIAGNOSIS — L539 Erythematous condition, unspecified: Secondary | ICD-10-CM | POA: Diagnosis not present

## 2013-06-06 DIAGNOSIS — R609 Edema, unspecified: Secondary | ICD-10-CM | POA: Diagnosis not present

## 2013-07-01 ENCOUNTER — Other Ambulatory Visit: Payer: Self-pay

## 2013-09-02 ENCOUNTER — Other Ambulatory Visit: Payer: Self-pay | Admitting: Gastroenterology

## 2013-10-01 ENCOUNTER — Other Ambulatory Visit: Payer: Self-pay

## 2013-10-05 ENCOUNTER — Other Ambulatory Visit: Payer: Self-pay | Admitting: *Deleted

## 2013-10-05 DIAGNOSIS — K219 Gastro-esophageal reflux disease without esophagitis: Secondary | ICD-10-CM

## 2013-10-05 MED ORDER — OMEPRAZOLE 40 MG PO CPDR: 40.0000 mg | DELAYED_RELEASE_CAPSULE | Freq: Every day | ORAL | Status: DC

## 2013-10-05 MED ORDER — AMLODIPINE BESYLATE 5 MG PO TABS: ORAL_TABLET | ORAL | Status: DC

## 2013-10-05 NOTE — Telephone Encounter (Signed)
Amlodipine refill sent to pharmacy. OV due 

## 2013-10-08 ENCOUNTER — Other Ambulatory Visit: Payer: Self-pay

## 2013-10-08 DIAGNOSIS — Z1231 Encounter for screening mammogram for malignant neoplasm of breast: Secondary | ICD-10-CM

## 2013-10-12 DIAGNOSIS — Z23 Encounter for immunization: Secondary | ICD-10-CM | POA: Diagnosis not present

## 2013-11-03 DIAGNOSIS — C549 Malignant neoplasm of corpus uteri, unspecified: Secondary | ICD-10-CM | POA: Diagnosis not present

## 2013-11-03 DIAGNOSIS — N952 Postmenopausal atrophic vaginitis: Secondary | ICD-10-CM | POA: Diagnosis not present

## 2013-11-11 ENCOUNTER — Ambulatory Visit
Admission: RE | Admit: 2013-11-11 | Discharge: 2013-11-11 | Disposition: A | Discharge status: Home / Self Care | Payer: Medicare Other | Source: Ambulatory Visit

## 2013-11-11 DIAGNOSIS — Z1231 Encounter for screening mammogram for malignant neoplasm of breast: Secondary | ICD-10-CM | POA: Diagnosis not present

## 2013-12-30 ENCOUNTER — Other Ambulatory Visit: Payer: Self-pay | Admitting: Internal Medicine

## 2013-12-31 NOTE — Telephone Encounter (Signed)
Pt needs an OV  

## 2014-02-08 DIAGNOSIS — L98499 Non-pressure chronic ulcer of skin of other sites with unspecified severity: Secondary | ICD-10-CM | POA: Diagnosis not present

## 2014-02-08 DIAGNOSIS — Z09 Encounter for follow-up examination after completed treatment for conditions other than malignant neoplasm: Secondary | ICD-10-CM | POA: Diagnosis not present

## 2014-02-08 DIAGNOSIS — Z923 Personal history of irradiation: Secondary | ICD-10-CM | POA: Diagnosis not present

## 2014-02-08 DIAGNOSIS — Z809 Family history of malignant neoplasm, unspecified: Secondary | ICD-10-CM | POA: Diagnosis not present

## 2014-02-08 DIAGNOSIS — C549 Malignant neoplasm of corpus uteri, unspecified: Secondary | ICD-10-CM | POA: Diagnosis not present

## 2014-02-08 DIAGNOSIS — Z8542 Personal history of malignant neoplasm of other parts of uterus: Secondary | ICD-10-CM | POA: Diagnosis not present

## 2014-02-08 DIAGNOSIS — K626 Ulcer of anus and rectum: Secondary | ICD-10-CM | POA: Diagnosis not present

## 2014-02-08 DIAGNOSIS — N950 Postmenopausal bleeding: Secondary | ICD-10-CM | POA: Diagnosis not present

## 2014-02-22 ENCOUNTER — Other Ambulatory Visit: Payer: Self-pay | Admitting: Internal Medicine

## 2014-02-23 DIAGNOSIS — K649 Unspecified hemorrhoids: Secondary | ICD-10-CM | POA: Diagnosis not present

## 2014-02-23 DIAGNOSIS — C549 Malignant neoplasm of corpus uteri, unspecified: Secondary | ICD-10-CM | POA: Diagnosis not present

## 2014-04-13 ENCOUNTER — Telehealth: Payer: Self-pay | Admitting: Internal Medicine

## 2014-04-13 ENCOUNTER — Ambulatory Visit (INDEPENDENT_AMBULATORY_CARE_PROVIDER_SITE_OTHER): Payer: Medicare Other | Admitting: Internal Medicine

## 2014-04-13 ENCOUNTER — Other Ambulatory Visit (INDEPENDENT_AMBULATORY_CARE_PROVIDER_SITE_OTHER): Payer: Medicare Other

## 2014-04-13 ENCOUNTER — Encounter: Payer: Self-pay | Admitting: Internal Medicine

## 2014-04-13 VITALS — BP 140/78 | HR 68 | Temp 97.7°F | Wt 149.0 lb

## 2014-04-13 DIAGNOSIS — I10 Essential (primary) hypertension: Secondary | ICD-10-CM

## 2014-04-13 LAB — BASIC METABOLIC PANEL
BUN: 14 mg/dL (ref 6–23)
CALCIUM: 9.7 mg/dL (ref 8.4–10.5)
CO2: 27 mEq/L (ref 19–32)
CREATININE: 0.8 mg/dL (ref 0.4–1.2)
Chloride: 106 mEq/L (ref 96–112)
GFR: 69.04 mL/min (ref >60.00)
GLUCOSE: 79 mg/dL (ref 70–99)
Potassium: 4.2 mEq/L (ref 3.5–5.1)
Sodium: 141 mEq/L (ref 135–145)

## 2014-04-13 MED ORDER — AMLODIPINE BESYLATE 5 MG PO TABS: ORAL_TABLET | ORAL | Status: DC

## 2014-04-13 NOTE — Progress Notes (Signed)
Pre visit review using our clinic review tool, if applicable. No additional management support is needed unless otherwise documented below in the visit note. 

## 2014-04-13 NOTE — Telephone Encounter (Signed)
Relevant patient education assigned to patient using Emmi. ° °

## 2014-04-13 NOTE — Patient Instructions (Signed)
Minimal Blood Pressure Goal= AVERAGE < 140/90;  Ideal is an AVERAGE < 135/85. This AVERAGE should be calculated from @ least 5-7 BP readings taken @ different times of day on different days of week. You should not respond to isolated BP readings , but rather the AVERAGE for that week .Please bring your  blood pressure cuff to office visits to verify that it is reliable.It  can also be checked against the blood pressure device at the pharmacy. Finger or wrist cuffs are not dependable; an arm cuff is.  Your next office appointment will be determined based upon review of your pending labs. Those instructions will be transmitted to you through My Chart  OR  by mail;whichever process is your choice to receive results & recommendations .  

## 2014-04-14 NOTE — Progress Notes (Signed)
   Subjective:    Patient ID: Toni Hayes, female    DOB: September 06, 1924, 80 y.o.   MRN: [id number]  HPI Blood pressure range / average : no monitor Compliant with anti hypertemsive medication. No lightheadedness or other adverse medication effect described.  A heart healthy /low salt diet is followed.  She is being followed @ [facility] ; S/P gyn surgery for uterine cancer.   Review of Systems   Significant headaches, epistaxis, chest pain, palpitations, exertional dyspnea, claudication, paroxysmal nocturnal dyspnea, or edema absent.        Objective:   Physical Exam Gen.:  well-nourished in appearance. Weight excess.Alert, appropriate and cooperative throughout exam. Appears younger than stated age  Head: Normocephalic without obvious abnormalities Ears: slight decreased hearing Eyes: No corneal or conjunctival inflammation noted. Pupils equal round reactive to light and accommodation.  Neck: No deformities, masses, or tenderness noted. Thyroid normal Lungs: Normal respiratory effort; chest expands symmetrically. Lungs are clear to auscultation without rales, wheezes, or increased work of breathing. Heart: Normal rate and rhythm. Normal S1 and S2. No gallop, click, or rub. S4 w/o murmur. Abdomen: Bowel sounds normal; abdomen soft and nontender. No masses, organomegaly or hernias noted.                              Musculoskeletal/extremities: Accentuated curvature of  thoracic spine.  No clubbing, cyanosis, edema, or significant extremity  deformity noted. Tone & strength normal. Hand joints normal Fingernail health good. Able to lie down & situp w/ o help. Negative SLR bilaterally Vascular: Carotid, radial artery, dorsalis pedis and  posterior tibial pulses are  equal. Decreased PTPNo bruits present. Neurologic: Alert and oriented x3. Deep tendon reflexes symmetrical and normal.  Gait normal Skin: Intact without suspicious lesions or rashes. Lymph: No cervical, axillary  lymphadenopathy present. Psych: Mood and affect are normal. Normally interactive                                                                                        Assessment & Plan:  #1 HTN See orders & AVS

## 2014-04-21 DIAGNOSIS — Z23 Encounter for immunization: Secondary | ICD-10-CM | POA: Diagnosis not present

## 2014-05-06 ENCOUNTER — Other Ambulatory Visit: Payer: Self-pay | Admitting: Gastroenterology

## 2014-05-21 ENCOUNTER — Other Ambulatory Visit: Payer: Self-pay | Admitting: Gastroenterology

## 2014-11-02 ENCOUNTER — Other Ambulatory Visit: Payer: Self-pay

## 2014-11-02 DIAGNOSIS — Z1231 Encounter for screening mammogram for malignant neoplasm of breast: Secondary | ICD-10-CM

## 2014-12-02 ENCOUNTER — Ambulatory Visit
Admission: RE | Admit: 2014-12-02 | Discharge: 2014-12-02 | Disposition: A | Discharge status: Home / Self Care | Payer: Medicare Other | Source: Ambulatory Visit

## 2014-12-02 DIAGNOSIS — Z1231 Encounter for screening mammogram for malignant neoplasm of breast: Secondary | ICD-10-CM | POA: Diagnosis not present

## 2015-02-08 DIAGNOSIS — C549 Malignant neoplasm of corpus uteri, unspecified: Secondary | ICD-10-CM | POA: Diagnosis not present

## 2015-02-08 DIAGNOSIS — Z8542 Personal history of malignant neoplasm of other parts of uterus: Secondary | ICD-10-CM | POA: Diagnosis not present

## 2015-02-08 DIAGNOSIS — Z780 Asymptomatic menopausal state: Secondary | ICD-10-CM | POA: Diagnosis not present

## 2015-02-08 DIAGNOSIS — L292 Pruritus vulvae: Secondary | ICD-10-CM | POA: Diagnosis not present

## 2015-05-19 DIAGNOSIS — C541 Malignant neoplasm of endometrium: Secondary | ICD-10-CM | POA: Diagnosis not present

## 2015-05-19 DIAGNOSIS — C549 Malignant neoplasm of corpus uteri, unspecified: Secondary | ICD-10-CM | POA: Diagnosis not present

## 2015-05-19 DIAGNOSIS — Z79899 Other long term (current) drug therapy: Secondary | ICD-10-CM | POA: Diagnosis not present

## 2015-05-19 DIAGNOSIS — Z08 Encounter for follow-up examination after completed treatment for malignant neoplasm: Secondary | ICD-10-CM | POA: Diagnosis not present

## 2015-05-19 DIAGNOSIS — Z780 Asymptomatic menopausal state: Secondary | ICD-10-CM | POA: Diagnosis not present

## 2015-05-19 DIAGNOSIS — Z8542 Personal history of malignant neoplasm of other parts of uterus: Secondary | ICD-10-CM | POA: Diagnosis not present

## 2015-05-19 DIAGNOSIS — N939 Abnormal uterine and vaginal bleeding, unspecified: Secondary | ICD-10-CM | POA: Diagnosis not present

## 2015-09-23 DIAGNOSIS — Z23 Encounter for immunization: Secondary | ICD-10-CM | POA: Diagnosis not present

## 2015-09-28 DIAGNOSIS — Z803 Family history of malignant neoplasm of breast: Secondary | ICD-10-CM | POA: Diagnosis not present

## 2015-09-28 DIAGNOSIS — Z800 Family history of malignant neoplasm of digestive organs: Secondary | ICD-10-CM | POA: Diagnosis not present

## 2015-09-28 DIAGNOSIS — Z23 Encounter for immunization: Secondary | ICD-10-CM | POA: Diagnosis not present

## 2015-09-28 DIAGNOSIS — Z8543 Personal history of malignant neoplasm of ovary: Secondary | ICD-10-CM | POA: Diagnosis not present

## 2015-09-28 DIAGNOSIS — I10 Essential (primary) hypertension: Secondary | ICD-10-CM | POA: Diagnosis not present

## 2015-09-30 ENCOUNTER — Encounter: Payer: Self-pay | Admitting: Gastroenterology

## 2015-10-03 DIAGNOSIS — M810 Age-related osteoporosis without current pathological fracture: Secondary | ICD-10-CM | POA: Diagnosis not present

## 2015-10-27 ENCOUNTER — Telehealth: Payer: Self-pay | Admitting: Cardiology

## 2015-10-27 NOTE — Telephone Encounter (Signed)
Received records from [facility] for appointment on 12/01/15 with Dr Ellyn Hack.  Records given to [facility] (medical records) for Dr Allison Quarry schedule on 12/01/15. lp

## 2015-11-01 DIAGNOSIS — C541 Malignant neoplasm of endometrium: Secondary | ICD-10-CM | POA: Diagnosis not present

## 2015-11-01 DIAGNOSIS — C549 Malignant neoplasm of corpus uteri, unspecified: Secondary | ICD-10-CM | POA: Diagnosis not present

## 2015-11-15 DIAGNOSIS — Z7721 Contact with and (suspected) exposure to potentially hazardous body fluids: Secondary | ICD-10-CM | POA: Diagnosis not present

## 2015-11-15 DIAGNOSIS — I10 Essential (primary) hypertension: Secondary | ICD-10-CM | POA: Diagnosis not present

## 2015-11-15 DIAGNOSIS — E785 Hyperlipidemia, unspecified: Secondary | ICD-10-CM | POA: Diagnosis not present

## 2015-11-15 DIAGNOSIS — M810 Age-related osteoporosis without current pathological fracture: Secondary | ICD-10-CM | POA: Diagnosis not present

## 2015-12-01 ENCOUNTER — Encounter: Payer: Self-pay | Admitting: Cardiology

## 2015-12-01 ENCOUNTER — Ambulatory Visit (INDEPENDENT_AMBULATORY_CARE_PROVIDER_SITE_OTHER): Payer: Medicare Other | Admitting: Cardiology

## 2015-12-01 VITALS — BP 140/86 | HR 61 | Ht 60.0 in | Wt 148.8 lb

## 2015-12-01 DIAGNOSIS — R9431 Abnormal electrocardiogram [ECG] [EKG]: Secondary | ICD-10-CM

## 2015-12-01 DIAGNOSIS — E785 Hyperlipidemia, unspecified: Secondary | ICD-10-CM

## 2015-12-01 DIAGNOSIS — I10 Essential (primary) hypertension: Secondary | ICD-10-CM | POA: Diagnosis not present

## 2015-12-01 NOTE — Patient Instructions (Signed)
NO CHANGE IN CURRENT MEDICATIONS  NO CHANGE IN EKG FROM TODAY AND 2013 NO OTHER TESTING NEEDED.  Your physician recommends that you schedule a follow-up appointment ON AS NEEDED BASIS

## 2015-12-01 NOTE — Progress Notes (Signed)
PATIENT: Toni Hayes MRN: [id number] DOB: 09-Jul-1924 PCP: Horatio Pel, MD  Clinic Note: Chief Complaint  Patient presents with  . New Evaluation    referred by Dr. Shelia Media for abnormal EKG//pt states no Sx.    HPI: Toni Hayes is a 82 y.o. 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  She was seen in April of 2013 by Dr. Acie Fredrickson (then with [facility]) for chest discomfort & dyspnea.  Based upon abnormal EKG, she had a Myoview & Echocardiogram that were essentially normal (as noted in [facility] -- I personally reviewed the results - studies & reports).  She has not had any cardiac concerns since then.  Interval History: Toni Hayes actually presents today unsure why she is being referred to cardiology.  She is active, but doesn't do 8 routine exercise -- her reasoning is that she is "just lazy ". She feels that given her age she does what she wants to do and doesn't worry about it. She is able to get around and do activities that she wants to without any issues. No chest tightness or pressure with rest or exertion. No heart failure symptoms of PND, orthopnea or edema. No rapid or irregular heartbeat/palpitations or rhythms.  Cardiovascular ROS: no chest pain or dyspnea on exertion positive for - Reduced exercise tolerance negative for - edema, irregular heartbeat, loss of consciousness, murmur, orthopnea, palpitations, paroxysmal nocturnal dyspnea, rapid heart rate, shortness of breath or Syncope/near-syncope, TIA/amaurosis fugax. :   Past Medical History  Diagnosis Date  . B12 deficiency   . Malignant neoplasm of uterus ([place])   . Hyperlipidemia LDL goal <130   . Carpal tunnel syndrome, right   . Essential hypertension   . Esophageal stricture   . GERD (gastroesophageal reflux disease)   . Hemorrhage of rectum and anus   . Back pain, thoracic     region  . Disorder of bone and cartilage   . Sleep disorder    . Urinary frequency   . Circulatory disease     Personal Hx of unspecified  . Hx of colonic polyps   . Abnormal ECG     iRBBB, LAFB - no change from 2013    Prior Cardiac Evaluation and Past Surgical History: Past Surgical History  Procedure Laterality Date  . Cholecystectomy    . Breast lumpectomy    . Esophageal dilation  2002  . Colonoscopy w/ biopsies and polypectomy    . Esophageal dilation  2006    Dr Sharlett Iles  . Ankle fracture surgery    . Wrist fracture surgery      left arm  . Total abdominal hysterectomy w/ bilateral salpingoophorectomy  2011    Dr Marlaine Hind; [facility]  . Colonoscopy  08/20/12  . Upper gastrointestinal endoscopy  08/20/12  . Nm lexiscan myoview ltd  May 2013    Normal stress nuclear study.  LV Ejection Fraction: 70%. LV Wall Motion: NL LV Function; NL Wall Motion  . Transthoracic echocardiogram  May 2013    Mild LVH, normal wall motion. EF ~55-60%. Mild AI & MR.  Mod LA dilation. -- Essentially normal Echo for age    Allergies  Allergen Reactions  . Codeine     rash  . Meperidine Rash    Current Outpatient Prescriptions  Medication Sig Dispense Refill  . estradiol (ESTRACE) 0.1 MG/GM vaginal cream Place 1 Applicatorful vaginally daily.    . Acetaminophen (  TYLENOL PO) Take by mouth.    . AMBULATORY NON FORMULARY MEDICATION Medication Name: [employer] Flex Support: once daily    . amLODipine (NORVASC) 5 MG tablet TAKE 1 TABLET EVERY DAY 90 tablet 3  . Apoaequorin (PREVAGEN PO) Take by mouth daily.    . Calcium Carbonate (CALCIUM 500 PO) Take 1,200 mg by mouth 2 (two) times daily.    . Cholecalciferol (VITAMIN D-400 PO) Take by mouth daily.     Marland Kitchen desoximetasone (TOPICORT) 0.05 % cream Apply topically 2 (two) times daily. 45 g 0  . Docusate Calcium (STOOL SOFTENER PO) Take by mouth as needed.    . Multiple Vitamin (MULTIVITAMIN) tablet Take 1 tablet by mouth daily.    Marland Kitchen Phenyleph-Shark Liv Oil-MO-Pet (PREP-HEM RE) Place rectally as directed.     . pravastatin (PRAVACHOL) 20 MG tablet TAKE ONE TABLET BY MOUTH AT BEDTIME. OVERDUE FOR LABS 30 tablet 0  . Probiotic Product (PROBIOTIC DAILY PO) Take by mouth daily.    Marland Kitchen TANDEM 162-115.2 MG CAPS TAKE 1 CAPSULE BY MOUTH DAILY WITH BREAKFAST. 30 capsule 3  . vitamin B-12 (CYANOCOBALAMIN) 100 MCG tablet Take 50 mcg by mouth daily.     No current facility-administered medications for this visit.    Social History   Social History Narrative    family history includes Breast cancer in her mother; Colon cancer in her son; Diabetes in her brother; Heart attack in her brother; Liver cancer in her brother; Lung cancer in her brother; Stroke in her father. There is no history of Colon polyps, Rectal cancer, or Stomach cancer.  ROS: A comprehensive Review of Systems -  Review of Systems  Constitutional: Negative for malaise/fatigue.  HENT: Negative for nosebleeds.   Respiratory: Negative for cough and shortness of breath.   Cardiovascular: Negative for claudication.  Gastrointestinal: Negative for blood in stool and melena.  Genitourinary: Negative for flank pain.  Neurological: Negative for dizziness and loss of consciousness.  Endo/Heme/Allergies: Does not bruise/bleed easily.  Psychiatric/Behavioral: The patient does not have insomnia.   All other systems reviewed and are negative.   PHYSICAL EXAM BP 140/86 mmHg  Pulse 61  Ht 5' (1.524 m)  Wt 148 lb 12.8 oz (67.495 kg)  BMI 29.06 kg/m2 General appearance: alert, cooperative, appears stated age, no distress and Well-nourished, well-groomed. Pleasant mood and affect. Healthy-appearing HEENT: [place]/AT, EOMI, MMM, anicteric sclera Neck: no adenopathy, no carotid bruit and no JVD Lungs: clear to auscultation bilaterally, normal percussion bilaterally and Nonlabored, good air movement Heart: regular rate and rhythm, S1, S2 normal, no murmur, click, rub or gallop, normal apical impulse and She may have a slightly split S2. Abdomen: soft,  non-tender; bowel sounds normal; no masses,  no organomegaly Extremities: extremities normal, atraumatic, no cyanosis or edema Pulses: 2+ and symmetric Skin: Skin color, texture, turgor normal. No rashes or lesions Neurologic: Alert and oriented X 3, normal strength and tone. Normal symmetric reflexes. Normal coordination and gait   Adult ECG Report  Rate: 61 ;  Rhythm: normal sinus rhythm; LAFB with Incomplete RBBB.  Conduction Disturbances: left anterior fascicular block and Incomplete RBBB  Other Abnormalities: CRO Septal Infarct, age undetermined   Narrative Interpretation: Stable EKG when compared to 02/2012 - no significant changes.  Recent Labs: n/a   ASSESSMENT / PLAN: She is a otherwise healthy, pleasant elderly woman with a stable abnormal EKG from April 2013. This was evaluated with both an echocardiogram and Myoview which were negative. As there has been no change  in EKG, and no change in symptoms, I see no need to do any additional cardiac evaluation. She does have borderline hypertension which is being monitored by her PCP. She is on pravastatin for presumptive dyslipidemia. This can also manage her PCP. I think she is relatively stable chronic standpoint. I would not recommend any further cardiac evaluation in the absence of symptoms for this EKG.  Problem List Items Addressed This Visit    Hyperlipidemia LDL goal <130 (Chronic)    Monitored by PCP. On pravastatin. -- No myalgias.      Relevant Orders   EKG 12-Lead   Essential hypertension (Chronic)    Borderline pressure today. She is stable on low dose amlodipine.      Relevant Orders   EKG 12-Lead   Abnormal ECG - Primary (Chronic)    I reviewed her EKG here today and compared it to her EKG from April 2013. No real changes noted. At that time this was evaluated already with an echocardiogram and Myoview. At this point I don't think any additional evaluation is required.      Relevant Orders   EKG 12-Lead       Meds ordered this encounter  Medications  . estradiol (ESTRACE) 0.1 MG/GM vaginal cream    Sig: Place 1 Applicatorful vaginally daily.    Followup: PRN  DAVID W. Ellyn Hack, M.D., M.S. Interventional Cardiolgy [facility]

## 2015-12-03 ENCOUNTER — Encounter: Payer: Self-pay | Admitting: Cardiology

## 2015-12-03 DIAGNOSIS — I10 Essential (primary) hypertension: Secondary | ICD-10-CM | POA: Insufficient documentation

## 2015-12-03 DIAGNOSIS — R9431 Abnormal electrocardiogram [ECG] [EKG]: Secondary | ICD-10-CM | POA: Insufficient documentation

## 2015-12-03 DIAGNOSIS — E785 Hyperlipidemia, unspecified: Secondary | ICD-10-CM | POA: Insufficient documentation

## 2015-12-03 NOTE — Assessment & Plan Note (Addendum)
Borderline pressure today. She is stable on low dose amlodipine.

## 2015-12-03 NOTE — Assessment & Plan Note (Signed)
Monitored by PCP. On pravastatin. -- No myalgias.

## 2015-12-03 NOTE — Assessment & Plan Note (Signed)
I reviewed her EKG here today and compared it to her EKG from April 2013. No real changes noted. At that time this was evaluated already with an echocardiogram and Myoview. At this point I don't think any additional evaluation is required.

## 2016-01-02 DIAGNOSIS — H04121 Dry eye syndrome of right lacrimal gland: Secondary | ICD-10-CM | POA: Diagnosis not present

## 2016-01-24 ENCOUNTER — Other Ambulatory Visit: Payer: Self-pay

## 2016-01-24 DIAGNOSIS — Z1231 Encounter for screening mammogram for malignant neoplasm of breast: Secondary | ICD-10-CM

## 2016-02-15 ENCOUNTER — Ambulatory Visit
Admission: RE | Admit: 2016-02-15 | Discharge: 2016-02-15 | Disposition: A | Discharge status: Home / Self Care | Payer: Medicare Other | Source: Ambulatory Visit

## 2016-02-15 DIAGNOSIS — Z1231 Encounter for screening mammogram for malignant neoplasm of breast: Secondary | ICD-10-CM | POA: Diagnosis not present

## 2016-02-16 ENCOUNTER — Other Ambulatory Visit: Payer: Self-pay | Admitting: Internal Medicine

## 2016-02-16 DIAGNOSIS — R928 Other abnormal and inconclusive findings on diagnostic imaging of breast: Secondary | ICD-10-CM

## 2016-02-23 ENCOUNTER — Ambulatory Visit
Admission: RE | Admit: 2016-02-23 | Discharge: 2016-02-23 | Disposition: A | Discharge status: Home / Self Care | Payer: Medicare Other | Source: Ambulatory Visit | Attending: Internal Medicine | Admitting: Internal Medicine

## 2016-02-23 DIAGNOSIS — R928 Other abnormal and inconclusive findings on diagnostic imaging of breast: Secondary | ICD-10-CM | POA: Diagnosis not present

## 2016-02-23 DIAGNOSIS — N6489 Other specified disorders of breast: Secondary | ICD-10-CM | POA: Diagnosis not present

## 2016-07-25 ENCOUNTER — Other Ambulatory Visit: Payer: Self-pay | Admitting: Internal Medicine

## 2016-07-25 DIAGNOSIS — N63 Unspecified lump in unspecified breast: Secondary | ICD-10-CM

## 2016-08-01 ENCOUNTER — Ambulatory Visit
Admission: RE | Admit: 2016-08-01 | Discharge: 2016-08-01 | Disposition: A | Discharge status: Home / Self Care | Payer: Medicare Other | Source: Ambulatory Visit | Attending: Internal Medicine | Admitting: Internal Medicine

## 2016-08-01 DIAGNOSIS — R928 Other abnormal and inconclusive findings on diagnostic imaging of breast: Secondary | ICD-10-CM | POA: Diagnosis not present

## 2016-08-01 DIAGNOSIS — N63 Unspecified lump in unspecified breast: Secondary | ICD-10-CM

## 2016-08-02 DIAGNOSIS — M150 Primary generalized (osteo)arthritis: Secondary | ICD-10-CM | POA: Diagnosis not present

## 2016-08-02 DIAGNOSIS — Z23 Encounter for immunization: Secondary | ICD-10-CM | POA: Diagnosis not present

## 2016-10-08 DIAGNOSIS — M810 Age-related osteoporosis without current pathological fracture: Secondary | ICD-10-CM | POA: Diagnosis not present

## 2016-10-08 DIAGNOSIS — E785 Hyperlipidemia, unspecified: Secondary | ICD-10-CM | POA: Diagnosis not present

## 2016-10-08 DIAGNOSIS — I10 Essential (primary) hypertension: Secondary | ICD-10-CM | POA: Diagnosis not present

## 2016-10-11 DIAGNOSIS — I444 Left anterior fascicular block: Secondary | ICD-10-CM | POA: Diagnosis not present

## 2016-10-11 DIAGNOSIS — M810 Age-related osteoporosis without current pathological fracture: Secondary | ICD-10-CM | POA: Diagnosis not present

## 2016-10-11 DIAGNOSIS — I10 Essential (primary) hypertension: Secondary | ICD-10-CM | POA: Diagnosis not present

## 2016-10-11 DIAGNOSIS — E785 Hyperlipidemia, unspecified: Secondary | ICD-10-CM | POA: Diagnosis not present

## 2016-10-25 DIAGNOSIS — M79644 Pain in right finger(s): Secondary | ICD-10-CM | POA: Diagnosis not present

## 2016-10-25 DIAGNOSIS — M1990 Unspecified osteoarthritis, unspecified site: Secondary | ICD-10-CM | POA: Diagnosis not present

## 2016-10-25 DIAGNOSIS — M19042 Primary osteoarthritis, left hand: Secondary | ICD-10-CM | POA: Diagnosis not present

## 2016-10-25 DIAGNOSIS — M19041 Primary osteoarthritis, right hand: Secondary | ICD-10-CM | POA: Diagnosis not present

## 2016-11-26 HISTORY — PX: TRANSTHORACIC ECHOCARDIOGRAM: SHX275

## 2016-12-08 ENCOUNTER — Encounter (HOSPITAL_COMMUNITY): Payer: Self-pay | Admitting: Emergency Medicine

## 2016-12-08 ENCOUNTER — Emergency Department (HOSPITAL_COMMUNITY): Payer: Medicare Other

## 2016-12-08 ENCOUNTER — Inpatient Hospital Stay (HOSPITAL_COMMUNITY)
Admission: EM | Admit: 2016-12-08 | Discharge: 2016-12-10 | DRG: 872 | Disposition: A | Discharge status: Home / Self Care | Payer: Medicare Other | Attending: Internal Medicine | Admitting: Internal Medicine

## 2016-12-08 ENCOUNTER — Ambulatory Visit (HOSPITAL_COMMUNITY)
Admission: EM | Admit: 2016-12-08 | Discharge: 2016-12-08 | Discharge status: Absent without leave | Payer: Medicare Other | Source: Home / Self Care

## 2016-12-08 DIAGNOSIS — D649 Anemia, unspecified: Secondary | ICD-10-CM | POA: Diagnosis present

## 2016-12-08 DIAGNOSIS — I472 Ventricular tachycardia: Secondary | ICD-10-CM | POA: Diagnosis present

## 2016-12-08 DIAGNOSIS — E785 Hyperlipidemia, unspecified: Secondary | ICD-10-CM | POA: Diagnosis present

## 2016-12-08 DIAGNOSIS — R059 Cough, unspecified: Secondary | ICD-10-CM

## 2016-12-08 DIAGNOSIS — R000 Tachycardia, unspecified: Secondary | ICD-10-CM | POA: Diagnosis not present

## 2016-12-08 DIAGNOSIS — R748 Abnormal levels of other serum enzymes: Secondary | ICD-10-CM | POA: Diagnosis present

## 2016-12-08 DIAGNOSIS — J111 Influenza due to unidentified influenza virus with other respiratory manifestations: Secondary | ICD-10-CM

## 2016-12-08 DIAGNOSIS — N390 Urinary tract infection, site not specified: Secondary | ICD-10-CM | POA: Diagnosis present

## 2016-12-08 DIAGNOSIS — M899 Disorder of bone, unspecified: Secondary | ICD-10-CM | POA: Diagnosis present

## 2016-12-08 DIAGNOSIS — A419 Sepsis, unspecified organism: Secondary | ICD-10-CM | POA: Diagnosis not present

## 2016-12-08 DIAGNOSIS — G479 Sleep disorder, unspecified: Secondary | ICD-10-CM | POA: Diagnosis present

## 2016-12-08 DIAGNOSIS — J9811 Atelectasis: Secondary | ICD-10-CM | POA: Diagnosis present

## 2016-12-08 DIAGNOSIS — Z86010 Personal history of colonic polyps: Secondary | ICD-10-CM

## 2016-12-08 DIAGNOSIS — Z800 Family history of malignant neoplasm of digestive organs: Secondary | ICD-10-CM

## 2016-12-08 DIAGNOSIS — Z8249 Family history of ischemic heart disease and other diseases of the circulatory system: Secondary | ICD-10-CM

## 2016-12-08 DIAGNOSIS — Z87891 Personal history of nicotine dependence: Secondary | ICD-10-CM

## 2016-12-08 DIAGNOSIS — Z8542 Personal history of malignant neoplasm of other parts of uterus: Secondary | ICD-10-CM

## 2016-12-08 DIAGNOSIS — J101 Influenza due to other identified influenza virus with other respiratory manifestations: Secondary | ICD-10-CM | POA: Diagnosis present

## 2016-12-08 DIAGNOSIS — K219 Gastro-esophageal reflux disease without esophagitis: Secondary | ICD-10-CM | POA: Diagnosis present

## 2016-12-08 DIAGNOSIS — N179 Acute kidney failure, unspecified: Secondary | ICD-10-CM | POA: Diagnosis not present

## 2016-12-08 DIAGNOSIS — I4510 Unspecified right bundle-branch block: Secondary | ICD-10-CM | POA: Diagnosis present

## 2016-12-08 DIAGNOSIS — Z803 Family history of malignant neoplasm of breast: Secondary | ICD-10-CM

## 2016-12-08 DIAGNOSIS — D696 Thrombocytopenia, unspecified: Secondary | ICD-10-CM | POA: Diagnosis present

## 2016-12-08 DIAGNOSIS — R509 Fever, unspecified: Secondary | ICD-10-CM | POA: Diagnosis not present

## 2016-12-08 DIAGNOSIS — I10 Essential (primary) hypertension: Secondary | ICD-10-CM | POA: Diagnosis present

## 2016-12-08 DIAGNOSIS — I351 Nonrheumatic aortic (valve) insufficiency: Secondary | ICD-10-CM | POA: Diagnosis present

## 2016-12-08 DIAGNOSIS — R05 Cough: Secondary | ICD-10-CM | POA: Diagnosis not present

## 2016-12-08 DIAGNOSIS — E860 Dehydration: Secondary | ICD-10-CM | POA: Diagnosis present

## 2016-12-08 DIAGNOSIS — E538 Deficiency of other specified B group vitamins: Secondary | ICD-10-CM | POA: Diagnosis present

## 2016-12-08 DIAGNOSIS — Z823 Family history of stroke: Secondary | ICD-10-CM

## 2016-12-08 DIAGNOSIS — Z79899 Other long term (current) drug therapy: Secondary | ICD-10-CM

## 2016-12-08 DIAGNOSIS — I959 Hypotension, unspecified: Secondary | ICD-10-CM | POA: Diagnosis present

## 2016-12-08 DIAGNOSIS — Z90710 Acquired absence of both cervix and uterus: Secondary | ICD-10-CM

## 2016-12-08 DIAGNOSIS — R531 Weakness: Secondary | ICD-10-CM | POA: Diagnosis not present

## 2016-12-08 DIAGNOSIS — Z885 Allergy status to narcotic agent status: Secondary | ICD-10-CM

## 2016-12-08 DIAGNOSIS — Z833 Family history of diabetes mellitus: Secondary | ICD-10-CM

## 2016-12-08 DIAGNOSIS — R630 Anorexia: Secondary | ICD-10-CM | POA: Diagnosis present

## 2016-12-08 DIAGNOSIS — Z801 Family history of malignant neoplasm of trachea, bronchus and lung: Secondary | ICD-10-CM

## 2016-12-08 DIAGNOSIS — R69 Illness, unspecified: Secondary | ICD-10-CM

## 2016-12-08 LAB — CBC WITH DIFFERENTIAL/PLATELET
BASOS ABS: 0 10*3/uL (ref 0.0–0.1)
Basophils Relative: 0 %
EOS PCT: 0 %
Eosinophils Absolute: 0 10*3/uL (ref 0.0–0.7)
HEMATOCRIT: 45.8 % (ref 36.0–46.0)
Hemoglobin: 15.5 g/dL — ABNORMAL HIGH (ref 12.0–15.0)
LYMPHS ABS: 1.3 10*3/uL (ref 0.7–4.0)
LYMPHS PCT: 11 %
MCH: 31.1 pg (ref 26.0–34.0)
MCHC: 33.8 g/dL (ref 30.0–36.0)
MCV: 91.8 fL (ref 78.0–100.0)
MONO ABS: 1.1 10*3/uL — AB (ref 0.1–1.0)
MONOS PCT: 10 %
NEUTROS ABS: 9.2 10*3/uL — AB (ref 1.7–7.7)
Neutrophils Relative %: 79 %
PLATELETS: 183 10*3/uL (ref 150–400)
RBC: 4.99 MIL/uL (ref 3.87–5.11)
RDW: 14.4 % (ref 11.5–15.5)
WBC: 11.7 10*3/uL — ABNORMAL HIGH (ref 4.0–10.5)

## 2016-12-08 LAB — COMPREHENSIVE METABOLIC PANEL
ALBUMIN: 3.5 g/dL (ref 3.5–5.0)
ALT: 11 U/L — ABNORMAL LOW (ref 14–54)
ANION GAP: 10 (ref 5–15)
AST: 18 U/L (ref 15–41)
Alkaline Phosphatase: 89 U/L (ref 38–126)
BILIRUBIN TOTAL: 0.9 mg/dL (ref 0.3–1.2)
BUN: 14 mg/dL (ref 6–20)
CHLORIDE: 104 mmol/L (ref 101–111)
CO2: 23 mmol/L (ref 22–32)
Calcium: 9.4 mg/dL (ref 8.9–10.3)
Creatinine, Ser: 1.2 mg/dL — ABNORMAL HIGH (ref 0.44–1.00)
GFR calc Af Amer: 45 mL/min — ABNORMAL LOW (ref >60)
GFR calc non Af Amer: 39 mL/min — ABNORMAL LOW (ref >60)
GLUCOSE: 141 mg/dL — AB (ref 65–99)
POTASSIUM: 4.2 mmol/L (ref 3.5–5.1)
SODIUM: 137 mmol/L (ref 135–145)
Total Protein: 7.2 g/dL (ref 6.5–8.1)

## 2016-12-08 LAB — TROPONIN I: Troponin I: <0.03 ng/mL (ref <0.03)

## 2016-12-08 LAB — URINALYSIS, ROUTINE W REFLEX MICROSCOPIC
GLUCOSE, UA: NEGATIVE mg/dL
KETONES UR: 15 mg/dL — AB
Nitrite: NEGATIVE
PH: 6 (ref 5.0–8.0)
PROTEIN: 30 mg/dL — AB
Specific Gravity, Urine: >1.03 — ABNORMAL HIGH (ref 1.005–1.030)

## 2016-12-08 LAB — I-STAT CG4 LACTIC ACID, ED
LACTIC ACID, VENOUS: 1.55 mmol/L (ref 0.5–1.9)
LACTIC ACID, VENOUS: 2.37 mmol/L — AB (ref 0.5–1.9)
Lactic Acid, Venous: 1.27 mmol/L (ref 0.5–1.9)

## 2016-12-08 LAB — INFLUENZA PANEL BY PCR (TYPE A & B)
Influenza A By PCR: POSITIVE — AB
Influenza B By PCR: NEGATIVE

## 2016-12-08 LAB — MAGNESIUM: MAGNESIUM: 1.9 mg/dL (ref 1.7–2.4)

## 2016-12-08 LAB — URINALYSIS, MICROSCOPIC (REFLEX)

## 2016-12-08 LAB — PHOSPHORUS: PHOSPHORUS: 3.5 mg/dL (ref 2.5–4.6)

## 2016-12-08 LAB — PROTIME-INR
INR: 1.17
Prothrombin Time: 14.9 seconds (ref 11.4–15.2)

## 2016-12-08 LAB — PROCALCITONIN: Procalcitonin: 0.15 ng/mL

## 2016-12-08 LAB — APTT: APTT: 31 s (ref 24–36)

## 2016-12-08 LAB — LACTIC ACID, PLASMA
Lactic Acid, Venous: 1.5 mmol/L (ref 0.5–1.9)
Lactic Acid, Venous: 1.3 mmol/L (ref 0.5–1.9)

## 2016-12-08 MED ORDER — PIPERACILLIN-TAZOBACTAM 3.375 G IVPB
3.3750 g | Freq: Three times a day (TID) | INTRAVENOUS | Status: DC
  Administered 2016-12-08 – 2016-12-09 (×2): 3.375 g via INTRAVENOUS
  Filled 2016-12-08 (×5): qty 50

## 2016-12-08 MED ORDER — ONDANSETRON HCL 4 MG/2ML IJ SOLN
4.0000 mg | Freq: Once | INTRAMUSCULAR | Status: AC
  Administered 2016-12-08: 4 mg via INTRAVENOUS
  Filled 2016-12-08: qty 2

## 2016-12-08 MED ORDER — VANCOMYCIN HCL IN DEXTROSE 1-5 GM/200ML-% IV SOLN
1000.0000 mg | Freq: Once | INTRAVENOUS | Status: AC
  Administered 2016-12-08: 1000 mg via INTRAVENOUS
  Filled 2016-12-08: qty 200

## 2016-12-08 MED ORDER — PRAVASTATIN SODIUM 20 MG PO TABS
20.0000 mg | ORAL_TABLET | Freq: Every day | ORAL | Status: DC
  Administered 2016-12-08 – 2016-12-09 (×2): 20 mg via ORAL
  Filled 2016-12-08 (×2): qty 1

## 2016-12-08 MED ORDER — ONDANSETRON HCL 4 MG PO TABS: 4.0000 mg | ORAL_TABLET | Freq: Four times a day (QID) | ORAL | Status: DC | PRN

## 2016-12-08 MED ORDER — SODIUM CHLORIDE 0.9 % IV BOLUS (SEPSIS)
1000.0000 mL | Freq: Once | INTRAVENOUS | Status: AC
  Administered 2016-12-08: 1000 mL via INTRAVENOUS

## 2016-12-08 MED ORDER — DOCUSATE SODIUM 100 MG PO CAPS: 100.0000 mg | ORAL_CAPSULE | Freq: Every day | ORAL | Status: DC | PRN

## 2016-12-08 MED ORDER — ADULT MULTIVITAMIN W/MINERALS CH
1.0000 | ORAL_TABLET | Freq: Every day | ORAL | Status: DC
Start: 2016-12-09 — End: 2016-12-10
  Administered 2016-12-09 – 2016-12-10 (×2): 1 via ORAL
  Filled 2016-12-08 (×3): qty 1

## 2016-12-08 MED ORDER — OSELTAMIVIR PHOSPHATE 75 MG PO CAPS
75.0000 mg | ORAL_CAPSULE | Freq: Once | ORAL | Status: DC
  Filled 2016-12-08: qty 1

## 2016-12-08 MED ORDER — PIPERACILLIN-TAZOBACTAM 3.375 G IVPB 30 MIN
3.3750 g | Freq: Once | INTRAVENOUS | Status: AC
  Administered 2016-12-08: 3.375 g via INTRAVENOUS
  Filled 2016-12-08: qty 50

## 2016-12-08 MED ORDER — RISAQUAD PO CAPS
ORAL_CAPSULE | Freq: Every day | ORAL | Status: DC
  Administered 2016-12-09 – 2016-12-10 (×2): 1 via ORAL
  Filled 2016-12-08 (×3): qty 1

## 2016-12-08 MED ORDER — VANCOMYCIN HCL IN DEXTROSE 750-5 MG/150ML-% IV SOLN
750.0000 mg | INTRAVENOUS | Status: DC
  Filled 2016-12-08: qty 150

## 2016-12-08 MED ORDER — VITAMIN B-12 100 MCG PO TABS
50.0000 ug | ORAL_TABLET | Freq: Every day | ORAL | Status: DC
  Administered 2016-12-09 – 2016-12-10 (×2): 50 ug via ORAL
  Filled 2016-12-08 (×2): qty 1

## 2016-12-08 MED ORDER — CALCIUM-MAGNESIUM-VITAMIN D 500-250-200 MG-MG-UNIT PO TABS: ORAL_TABLET | Freq: Two times a day (BID) | ORAL | Status: DC

## 2016-12-08 MED ORDER — ACETAMINOPHEN 500 MG PO TABS: 500.0000 mg | ORAL_TABLET | Freq: Four times a day (QID) | ORAL | Status: DC | PRN

## 2016-12-08 MED ORDER — ONDANSETRON HCL 4 MG/2ML IJ SOLN: 4.0000 mg | Freq: Four times a day (QID) | INTRAMUSCULAR | Status: DC | PRN

## 2016-12-08 MED ORDER — ENOXAPARIN SODIUM 30 MG/0.3ML ~~LOC~~ SOLN
30.0000 mg | SUBCUTANEOUS | Status: DC
  Administered 2016-12-08 – 2016-12-09 (×2): 30 mg via SUBCUTANEOUS
  Filled 2016-12-08 (×2): qty 0.3

## 2016-12-08 MED ORDER — SODIUM CHLORIDE 0.9% FLUSH
3.0000 mL | Freq: Two times a day (BID) | INTRAVENOUS | Status: DC
  Administered 2016-12-08 – 2016-12-10 (×4): 3 mL via INTRAVENOUS

## 2016-12-08 MED ORDER — POTASSIUM CHLORIDE IN NACL 20-0.9 MEQ/L-% IV SOLN
INTRAVENOUS | Status: DC
  Administered 2016-12-08 – 2016-12-09 (×2): via INTRAVENOUS
  Filled 2016-12-08 (×4): qty 1000

## 2016-12-08 MED ORDER — VITAMIN D 1000 UNITS PO TABS
1000.0000 [IU] | ORAL_TABLET | Freq: Every day | ORAL | Status: DC
  Administered 2016-12-09 – 2016-12-10 (×2): 1000 [IU] via ORAL
  Filled 2016-12-08 (×2): qty 1

## 2016-12-08 MED ORDER — BISACODYL 10 MG RE SUPP: 10.0000 mg | Freq: Every day | RECTAL | Status: DC | PRN

## 2016-12-08 MED ORDER — OSELTAMIVIR PHOSPHATE 30 MG PO CAPS
30.0000 mg | ORAL_CAPSULE | Freq: Every day | ORAL | Status: DC
  Administered 2016-12-09: 30 mg via ORAL

## 2016-12-08 MED ORDER — CALCIUM CARBONATE-VITAMIN D 500-200 MG-UNIT PO TABS
1.0000 | ORAL_TABLET | Freq: Two times a day (BID) | ORAL | Status: DC
  Administered 2016-12-09 – 2016-12-10 (×3): 1 via ORAL
  Filled 2016-12-08 (×3): qty 1

## 2016-12-08 MED ORDER — OSELTAMIVIR PHOSPHATE 30 MG PO CAPS
30.0000 mg | ORAL_CAPSULE | Freq: Once | ORAL | Status: AC
  Administered 2016-12-08: 30 mg via ORAL
  Filled 2016-12-08: qty 1

## 2016-12-08 NOTE — ED Notes (Signed)
Nurse from [facility] informed the ED that they could not take the patient if the lactic is trending upward.  I-stat lactic and plasma lactic was ordered.  Pending results can transport to [facility].

## 2016-12-08 NOTE — Progress Notes (Signed)
Pharmacy Antibiotic Note  Toni Hayes is a 83 y.o. female admitted on 12/08/2016 with pneumonia.  Pharmacy has been consulted for Vancomycin / Zosyn dosing.  Plan: Zosyn 3.375 grams iv Q 8 hours - 4 hr infusion Vancomycin 750 mg iv Q 24 hours  Height: 4\' 11"  (149.9 cm) Weight: 140 lb (63.5 kg) IBW/kg (Calculated) : 43.2  Temp (24hrs), Avg:99.9 F (37.7 C), Min:99.7 F (37.6 C), Max:100 F (37.8 C)   Recent Labs Lab 12/08/16 1456 12/08/16 1531 12/08/16 1821 12/08/16 2010 12/08/16 2019  WBC 11.7*  --   --   --   --   CREATININE 1.20*  --   --   --   --   LATICACIDVEN  --  1.55 2.37* 1.5 1.27    Estimated Creatinine Clearance: 25.2 mL/min (by C-G formula based on SCr of 1.2 mg/dL (H)).    Allergies  Allergen Reactions  . Codeine     rash  . Meperidine Rash     Thank you for allowing pharmacy to be a part of this patient's care.  Tad Moore 12/08/2016 9:18 PM

## 2016-12-08 NOTE — ED Provider Notes (Signed)
[facility] DEPT Provider Note   CSN: [id number] Arrival date & time: 12/08/16  1430     History   Chief Complaint Chief Complaint  Patient presents with  . Nasal Congestion  . Cough  . Fever    HPI Toni Hayes is a 83 y.o. female.  HPI  83 year old female presents today from home reports that she has had URI symptoms, cough for 2 days followed by some fever to 102 today. She has had decreased right step appetite but no nausea, vomiting, or diarrhea. Today she urinated after coming here to the ED. She received one Tylenol at home around noon. She was feeling okay but became very weak and diaphoretic while waiting to come to the back and was subsequently seen immediately by myself. Initial lactic acid is 1.7 and lipid pressure while she is feeling weak is 104/60. She endorses feeling generally weak without changes in strength of her hand, lateralized leg, vision, or ability to speak. She is followed by Dr. Concha Pyo takes no regular daily prescription medications she lives with her daughter who is present with her at the bedside and is giving most of the history  Past Medical History:  Diagnosis Date  . 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 B12 deficiency   . Back pain, thoracic    region  . Carpal tunnel syndrome, right   . Circulatory disease    Personal Hx of unspecified  . Disorder of bone and cartilage   . Esophageal stricture   . Essential hypertension   . GERD (gastroesophageal reflux disease)   . Hemorrhage of rectum and anus   . Hx of colonic polyps   . Hyperlipidemia LDL goal <130   . Malignant neoplasm of uterus ([place])   . Sleep disorder   . Urinary frequency     Patient Active Problem List   Diagnosis Date Noted  . Abnormal ECG   . Hyperlipidemia LDL goal <130   . Essential hypertension   . Hiatal hernia 09/05/2012  . Hx of TIA (transient ischemic attack) and stroke 07/31/2012  . Anemia, unspecified 07/23/2012  . Dyspnea 03/24/2012  .  Nonspecific abnormal electrocardiogram (ECG) (EKG) 02/19/2012  . BACK PAIN, THORACIC REGION 11/03/2010  . MALIGNANT NEOPLASM OF UTERUS PART UNSPECIFIED 06/26/2010  . CARPAL TUNNEL SYNDROME, RIGHT 01/18/2010  . GERD 12/30/2009  . UNS ADVRS EFF UNS RX MEDICINAL&BIOLOGICAL SBSTNC 12/31/2008  . Disorder of bone and cartilage, unspecified 12/30/2008  . SLEEP DISORDER 12/30/2008  . COLONIC POLYPS, HX OF 12/30/2008  . ESOPHAGEAL STRICTURE 10/30/2007  . B12 DEFICIENCY 03/27/2007    Past Surgical History:  Procedure Laterality Date  . ANKLE FRACTURE SURGERY    . BREAST LUMPECTOMY    . CHOLECYSTECTOMY    . COLONOSCOPY  08/20/12  . COLONOSCOPY W/ BIOPSIES AND POLYPECTOMY    . ESOPHAGEAL DILATION  2002  . ESOPHAGEAL DILATION  2006   Dr Sharlett Iles  . [facility] [employer]  May 2013   Normal stress nuclear study.  LV Ejection Fraction: 70%. LV Wall Motion: NL LV Function; NL Wall Motion  . TOTAL ABDOMINAL HYSTERECTOMY W/ BILATERAL SALPINGOOPHORECTOMY  2011   Dr Marlaine Hind; [facility]  . TRANSTHORACIC ECHOCARDIOGRAM  May 2013   Mild LVH, normal wall motion. EF ~55-60%. Mild AI & MR.  Mod LA dilation. -- Essentially normal Echo for age  . UPPER GASTROINTESTINAL ENDOSCOPY  08/20/12  . WRIST FRACTURE SURGERY     left arm  OB History    No data available       Home Medications    Prior to Admission medications   Medication Sig Start Date End Date Taking? Authorizing Provider  Acetaminophen (TYLENOL PO) Take by mouth.    Historical Provider, MD  AMBULATORY NON FORMULARY MEDICATION Medication Name: Ultimate Living Flex Support: once daily    Historical Provider, MD  amLODipine (NORVASC) 5 MG tablet TAKE 1 TABLET EVERY DAY 04/13/14   Hendricks Limes, MD  Apoaequorin (PREVAGEN PO) Take by mouth daily.    Historical Provider, MD  Calcium Carbonate (CALCIUM 500 PO) Take 1,200 mg by mouth 2 (two) times daily.    Historical Provider, MD  Cholecalciferol (VITAMIN D-400 PO) Take by mouth daily.      Historical Provider, MD  desoximetasone (TOPICORT) 0.05 % cream Apply topically 2 (two) times daily. 04/10/13   Hendricks Limes, MD  Docusate Calcium (STOOL SOFTENER PO) Take by mouth as needed.    Historical Provider, MD  Multiple Vitamin (MULTIVITAMIN) tablet Take 1 tablet by mouth daily.    Historical Provider, MD  Phenyleph-Shark Liv Oil-MO-Pet (PREP-HEM RE) Place rectally as directed.    Historical Provider, MD  pravastatin (PRAVACHOL) 20 MG tablet TAKE ONE TABLET BY MOUTH AT BEDTIME. OVERDUE FOR LABS 08/30/12   Hendricks Limes, MD  Probiotic Product (PROBIOTIC DAILY PO) Take by mouth daily.    Historical Provider, MD  TANDEM 162-115.2 MG CAPS TAKE 1 CAPSULE BY MOUTH DAILY WITH BREAKFAST. 09/02/13   Sable Feil, MD  vitamin B-12 (CYANOCOBALAMIN) 100 MCG tablet Take 50 mcg by mouth daily.    Historical Provider, MD    Family History Family History  Problem Relation Age of Onset  . Breast cancer Mother   . Stroke Father   . Diabetes Brother   . Heart attack Brother   . Lung cancer Brother   . Liver cancer Brother   . Colon cancer Son   . Colon polyps Neg Hx   . Rectal cancer Neg Hx   . Stomach cancer Neg Hx     Social History Social History  Substance Use Topics  . Smoking status: Former Smoker    Packs/day: 1.00    Years: 40.00    Quit date: 11/26/1972  . Smokeless tobacco: Never Used     Comment: quit about age 53  . Alcohol use 0.6 oz/week    1 Glasses of wine per week     Allergies   Codeine and Meperidine   Review of Systems Review of Systems  Constitutional: Positive for activity change, appetite change, diaphoresis, fatigue and fever.  HENT: Positive for congestion and rhinorrhea.   Respiratory: Positive for cough.   Cardiovascular: Negative.   Gastrointestinal: Negative.   Endocrine: Negative.   Genitourinary: Negative.   Musculoskeletal: Negative.   Allergic/Immunologic: Negative.   Neurological: Positive for weakness.  Psychiatric/Behavioral:  Negative.   All other systems reviewed and are negative.    Physical Exam Updated Vital Signs BP (!) 88/65   Pulse 106   Temp 99.7 F (37.6 C) (Oral)   Resp 17   Ht 4\' 11"  (1.499 m)   Wt 63.5 kg   SpO2 95%   BMI 28.28 kg/m   Physical Exam  Constitutional: She is oriented to person, place, and time. She appears well-developed and well-nourished. No distress.  Pale  HENT:  Head: Normocephalic and atraumatic.  Right Ear: External ear normal.  Left Ear: External ear normal.  Nose: Nose normal.  Eyes: Conjunctivae and EOM are normal. Pupils are equal, round, and reactive to light.  Neck: Normal range of motion. Neck supple.  Cardiovascular: Normal rate, regular rhythm and normal heart sounds.   Pulmonary/Chest: Effort normal. No respiratory distress. She has no wheezes. She has no rales. She exhibits no tenderness.  Abdominal: Soft. Bowel sounds are normal.  Musculoskeletal: Normal range of motion.  Neurological: She is alert and oriented to person, place, and time. She exhibits normal muscle tone. Coordination normal.  Skin: Skin is warm. She is diaphoretic. There is pallor.  Psychiatric: She has a normal mood and affect. Her behavior is normal. Thought content normal.  Nursing note and vitals reviewed.    ED Treatments / Results  Labs (all labs ordered are listed, but only abnormal results are displayed) Labs Reviewed  URINE CULTURE - Abnormal; Notable for the following:       Result Value   Culture MULTIPLE SPECIES PRESENT, SUGGEST RECOLLECTION (*)    All other components within normal limits  COMPREHENSIVE METABOLIC PANEL - Abnormal; Notable for the following:    Glucose, Bld 141 (*)    Creatinine, Ser 1.20 (*)    ALT 11 (*)    GFR calc non Af Amer 39 (*)    GFR calc Af Amer 45 (*)    All other components within normal limits  CBC WITH DIFFERENTIAL/PLATELET - Abnormal; Notable for the following:    WBC 11.7 (*)    Hemoglobin 15.5 (*)    Neutro Abs 9.2 (*)     Monocytes Absolute 1.1 (*)    All other components within normal limits  URINALYSIS, ROUTINE W REFLEX MICROSCOPIC - Abnormal; Notable for the following:    APPearance HAZY (*)    Specific Gravity, Urine >1.030 (*)    Hgb urine dipstick SMALL (*)    Bilirubin Urine SMALL (*)    Ketones, ur 15 (*)    Protein, ur 30 (*)    Leukocytes, UA SMALL (*)    All other components within normal limits  INFLUENZA PANEL BY PCR (TYPE A & B, H1N1) - Abnormal; Notable for the following:    Influenza A By PCR POSITIVE (*)    All other components within normal limits  URINALYSIS, MICROSCOPIC (REFLEX) - Abnormal; Notable for the following:    Bacteria, UA FEW (*)    Squamous Epithelial / LPF 0-5 (*)    All other components within normal limits  TROPONIN I - Abnormal; Notable for the following:    Troponin I 0.11 (*)    All other components within normal limits  CBC - Abnormal; Notable for the following:    Hemoglobin 11.9 (*)    Platelets 107 (*)    All other components within normal limits  COMPREHENSIVE METABOLIC PANEL - Abnormal; Notable for the following:    CO2 21 (*)    Calcium 8.3 (*)    Total Protein 5.4 (*)    Albumin 2.7 (*)    ALT 11 (*)    GFR calc non Af Amer 55 (*)    All other components within normal limits  CBC - Abnormal; Notable for the following:    Platelets 148 (*)    All other components within normal limits  I-STAT CG4 LACTIC ACID, ED - Abnormal; Notable for the following:    Lactic Acid, Venous 2.37 (*)    All other components within normal limits  CULTURE, BLOOD (ROUTINE X 2)  CULTURE, BLOOD (ROUTINE X 2)  LACTIC ACID, PLASMA  LACTIC ACID, PLASMA  LACTIC ACID, PLASMA  MAGNESIUM  PHOSPHORUS  TROPONIN I  TROPONIN I  HEMOGLOBIN A1C  LACTIC ACID, PLASMA  LACTIC ACID, PLASMA  PROCALCITONIN  PROTIME-INR  APTT  GLUCOSE, CAPILLARY  TSH  I-STAT CG4 LACTIC ACID, ED  I-STAT CG4 LACTIC ACID, ED    EKG  EKG Interpretation  Date/Time:  Saturday December 08 2016  16:03:44 EST Ventricular Rate:  90 PR Interval:    QRS Duration: 102 QT Interval:  366 QTC Calculation: 448 R Axis:   -64 Text Interpretation:  Sinus rhythm Incomplete RBBB and LAFB Probable left ventricular hypertrophy Anterior Q waves, possibly due to LVH Confirmed by Ivah Girardot MD, Andee Poles [phone]) on 12/08/2016 4:08:46 PM       Radiology Dg Chest 2 View  Result Date: 12/08/2016 CLINICAL DATA:  Cough, congestion and slight fever. EXAM: CHEST  2 VIEW COMPARISON:  08/01/2012 FINDINGS: Cardiac silhouette is mildly enlarged. There is a moderate size hiatal hernia. No mediastinal or hilar masses. No convincing adenopathy. Lungs are clear.  No pleural effusion.  No pneumothorax. Skeletal structures are demineralized but grossly intact. IMPRESSION: No acute cardiopulmonary disease. Electronically Signed   By: Lajean Manes M.D.   On: 12/08/2016 15:34    Procedures Procedures (including critical care time)  Medications Ordered in ED Medications - No data to display   Initial Impression / Assessment and Plan / ED Course  I have reviewed the triage vital signs and the nursing notes.  Pertinent labs & imaging results that were available during my care of the patient were reviewed by me and considered in my medical decision making (see chart for details).  Clinical Course   83 y.o. Female with influenza like illness presents today with fever, weakness, and episodic borderline hypotension.  Patient with normal lactic acid and bp improved with fluid bolus. Flu screen sent.  Antibiotics and tamiflu given.      Final Clinical Impressions(s) / ED Diagnoses   Final diagnoses:  Influenza-like illness  Sepsis, due to unspecified organism [facility])    New Prescriptions New Prescriptions   No medications on file     Pattricia Boss, MD 12/13/16 1350

## 2016-12-08 NOTE — H&P (Signed)
History and Physical    Toni Hayes [id number] DOB: 04/05/1924 DOA: 12/08/2016  Referring MD/NP/PA: Dr Jeanell Sparrow PCP: Horatio Pel, MD   Outpatient Specialists:  Patient coming from: Home  Chief Complaint: Sick  HPI: Toni Hayes is a 83 y.o. female who presented to the [facility] ED with 2 days history of generealised weakness, loss of appetite, clear nasal congestion with cough productive of clear sputum followed by fever of upto 102 today. She has had some nausea without vomiting.  She denied any shortness of breath, no chest pain, no palpitations, but has been dizzy some with some diaphoresis. She denied any sick contacts or recent travel.  She had taken some Tylenol at home with persistence of her symptoms and therefore came to the emergency room      She has had decreased right step appetite but no nausea, vomiting, or diarrhea. Today she urinated after coming here to the ED. She received one Tylenol at home around noon. She was feeling okay but became very weak and diaphoretic while waiting to come to the back and was subsequently seen immediately by myself. Initial lactic acid is 1.7 and lipid pressure while she is feeling weak is 104/60. She endorses feeling generally weak without changes in strength of her hand, lateralized leg, vision, or ability to speak. She is followed by Dr. Concha Pyo takes no regular daily prescription medications she lives with her daughter who is present with her at the bedside and is giving most of the history  ED Course: At the ED patient was noted to be febrile, as well as diaphoretic with borderline hypotension.  She was given IV fluid and antinauseant with improvement of her symptoms. Her a lactic acid level was noted to be high as well as mild leukocytosis for which reason  "code sepsis" instituted and was given IV antibiotics with vancomycin and Zosyn.   She was given a dose of Tamiflu in addition to this and influenza virus a and B PCR done.  Her chest  x-ray was unremarkable for any air space disease.  She is admitted for onward care.  Review of Systems: As per HPI otherwise 10 point review of systems negative.   Past Medical History:  Diagnosis Date  . 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 B12 deficiency   . Back pain, thoracic    region  . Carpal tunnel syndrome, right   . Circulatory disease    Personal Hx of unspecified  . Disorder of bone and cartilage   . Esophageal stricture   . Essential hypertension   . GERD (gastroesophageal reflux disease)   . Hemorrhage of rectum and anus   . Hx of colonic polyps   . Hyperlipidemia LDL goal <130   . Malignant neoplasm of uterus ([place])   . Sleep disorder   . Urinary frequency     Past Surgical History:  Procedure Laterality Date  . ANKLE FRACTURE SURGERY    . BREAST LUMPECTOMY    . CHOLECYSTECTOMY    . COLONOSCOPY  08/20/12  . COLONOSCOPY W/ BIOPSIES AND POLYPECTOMY    . ESOPHAGEAL DILATION  2002  . ESOPHAGEAL DILATION  2006   Dr Sharlett Iles  . [place] [employer]  May 2013   Normal stress nuclear study.  LV Ejection Fraction: 70%. LV Wall Motion: NL LV Function; NL Wall Motion  . TOTAL ABDOMINAL HYSTERECTOMY W/ BILATERAL SALPINGOOPHORECTOMY  2011   Dr Marlaine Hind; [facility]  . TRANSTHORACIC ECHOCARDIOGRAM  May 2013   Mild LVH, normal wall motion. EF ~55-60%. Mild AI & MR.  Mod LA dilation. -- Essentially normal Echo for age  . UPPER GASTROINTESTINAL ENDOSCOPY  08/20/12  . WRIST FRACTURE SURGERY     left arm     reports that she quit smoking about 44 years ago. She has a 40.00 pack-year smoking history. She has never used smokeless tobacco. She reports that she drinks about 0.6 oz of alcohol per week . She reports that she does not use drugs.  Allergies  Allergen Reactions  . Codeine     rash  . Meperidine Rash    Family History  Problem Relation Age of Onset  . Breast cancer Mother   . Stroke Father   . Diabetes Brother   . Heart attack Brother   .  Lung cancer Brother   . Liver cancer Brother   . Colon cancer Son   . Colon polyps Neg Hx   . Rectal cancer Neg Hx   . Stomach cancer Neg Hx      Prior to Admission medications   Medication Sig Start Date End Date Taking? Authorizing Provider  acetaminophen (TYLENOL) 500 MG tablet Take 500 mg by mouth every 6 (six) hours as needed for mild pain.   Yes Historical Provider, MD  Calcium Carbonate (CALCIUM 500 PO) Take 1,200 mg by mouth 2 (two) times daily.   Yes Historical Provider, MD  Cholecalciferol (VITAMIN D-400 PO) Take by mouth daily.    Yes Historical Provider, MD  Docusate Calcium (STOOL SOFTENER PO) Take by mouth as needed.   Yes Historical Provider, MD  Multiple Vitamin (MULTIVITAMIN) tablet Take 1 tablet by mouth daily.   Yes Historical Provider, MD  Probiotic Product (PROBIOTIC DAILY PO) Take by mouth daily.   Yes Historical Provider, MD  vitamin B-12 (CYANOCOBALAMIN) 100 MCG tablet Take 50 mcg by mouth daily.   Yes Historical Provider, MD  AMBULATORY NON FORMULARY MEDICATION Medication Name: Ultimate Living Flex Support: once daily    Historical Provider, MD  amLODipine (NORVASC) 5 MG tablet TAKE 1 TABLET EVERY DAY Patient not taking: Reported on 12/08/2016 04/13/14   Hendricks Limes, MD  desoximetasone (TOPICORT) 0.05 % cream Apply topically 2 (two) times daily. Patient not taking: Reported on 12/08/2016 04/10/13   Hendricks Limes, MD  pravastatin (PRAVACHOL) 20 MG tablet TAKE ONE TABLET BY MOUTH AT BEDTIME. OVERDUE FOR LABS Patient not taking: Reported on 12/08/2016 08/30/12   Hendricks Limes, MD  TANDEM 162-115.2 MG CAPS TAKE 1 CAPSULE BY MOUTH DAILY WITH BREAKFAST. Patient not taking: Reported on 12/08/2016 09/02/13   Sable Feil, MD    Physical Exam: Vitals:   12/08/16 1700 12/08/16 1715 12/08/16 1730 12/08/16 1745  BP: 118/63 118/58 100/58 (!) 95/54  Pulse:  78 60 90  Resp:   20 22  Temp:      TempSrc:      SpO2:  97% (!) 84% 90%  Weight:      Height:           Constitutional: NAD, calm, comfortable Vitals:   12/08/16 1700 12/08/16 1715 12/08/16 1730 12/08/16 1745  BP: 118/63 118/58 100/58 (!) 95/54  Pulse:  78 60 90  Resp:   20 22  Temp:      TempSrc:      SpO2:  97% (!) 84% 90%  Weight:      Height:       Eyes: PERRL, lids and conjunctivae normal ENMT:  Mucous membranes are dry. Posterior pharynx clear of any exudate or lesions.Normal dentition.  Neck: normal, supple, no masses, no thyromegaly Respiratory: clear to auscultation bilaterally, no wheezing, no crackles. Normal respiratory effort. No accessory muscle use.  Cardiovascular: Regular rate and rhythm, no murmurs / rubs / gallops. No extremity edema. 2+ pedal pulses. No carotid bruits.  Abdomen: no tenderness, no masses palpated. No hepatosplenomegaly. Bowel sounds positive.  Musculoskeletal: no clubbing / cyanosis. No joint deformity upper and lower extremities. Good ROM, no contractures. Normal muscle tone.  Skin: no rashes, lesions, ulcers. No induration Neurologic: CN 2-12 grossly intact. Sensation intact, DTR normal. Strength 5/5 in all 4.  Psychiatric: Normal judgment and insight. Alert and oriented x 3. Normal mood.    Labs on Admission: I have personally reviewed following labs and imaging studies  CBC:  Recent Labs Lab 12/08/16 1456  WBC 11.7*  NEUTROABS 9.2*  HGB 15.5*  HCT 45.8  MCV 91.8  PLT [id number]   Basic Metabolic Panel:  Recent Labs Lab 12/08/16 1456  NA 137  K 4.2  CL 104  CO2 23  GLUCOSE 141*  BUN 14  CREATININE 1.20*  CALCIUM 9.4   GFR: Estimated Creatinine Clearance: 25.2 mL/min (by C-G formula based on SCr of 1.2 mg/dL (H)). Liver Function Tests:  Recent Labs Lab 12/08/16 1456  AST 18  ALT 11*  ALKPHOS 89  BILITOT 0.9  PROT 7.2  ALBUMIN 3.5   No results for input(s): LIPASE, AMYLASE in the last 168 hours. No results for input(s): AMMONIA in the last 168 hours. Coagulation Profile: No results for input(s): INR, PROTIME in  the last 168 hours. Cardiac Enzymes: No results for input(s): CKTOTAL, CKMB, CKMBINDEX, TROPONINI in the last 168 hours. BNP (last 3 results) No results for input(s): PROBNP in the last 8760 hours. HbA1C: No results for input(s): HGBA1C in the last 72 hours. CBG: No results for input(s): GLUCAP in the last 168 hours. Lipid Profile: No results for input(s): CHOL, HDL, LDLCALC, TRIG, CHOLHDL, LDLDIRECT in the last 72 hours. Thyroid Function Tests: No results for input(s): TSH, T4TOTAL, FREET4, T3FREE, THYROIDAB in the last 72 hours. Anemia Panel: No results for input(s): VITAMINB12, FOLATE, FERRITIN, TIBC, IRON, RETICCTPCT in the last 72 hours. Urine analysis:    Component Value Date/Time   COLORURINE YELLOW 12/08/2016 1708   APPEARANCEUR HAZY (A) 12/08/2016 1708   LABSPEC >1.030 (H) 12/08/2016 1708   PHURINE 6.0 12/08/2016 1708   GLUCOSEU NEGATIVE 12/08/2016 1708   HGBUR SMALL (A) 12/08/2016 1708   HGBUR large 11/03/2010 1523   BILIRUBINUR SMALL (A) 12/08/2016 1708   KETONESUR 15 (A) 12/08/2016 1708   PROTEINUR 30 (A) 12/08/2016 1708   UROBILINOGEN negative 11/03/2010 1523   NITRITE NEGATIVE 12/08/2016 1708   LEUKOCYTESUR SMALL (A) 12/08/2016 1708   Sepsis Labs: @LABRCNTIP (procalcitonin:4,lacticidven:4) )No results found for this or any previous visit (from the past 240 hour(s)).   Radiological Exams on Admission: Dg Chest 2 View  Result Date: 12/08/2016 CLINICAL DATA:  Cough, congestion and slight fever. EXAM: CHEST  2 VIEW COMPARISON:  08/01/2012 FINDINGS: Cardiac silhouette is mildly enlarged. There is a moderate size hiatal hernia. No mediastinal or hilar masses. No convincing adenopathy. Lungs are clear.  No pleural effusion.  No pneumothorax. Skeletal structures are demineralized but grossly intact. IMPRESSION: No acute cardiopulmonary disease. Electronically Signed   By: Lajean Manes M.D.   On: 12/08/2016 15:34    EKG: ordered  Assessment/Plan Active Problems:    Influenza-like illness  1.  Febrile Illness: Likely influenza -follow up flu PCR results and consider initiation of Tamiflu as needed.  F/u blood and Urine Cultures.  Antibiotic for possible early sepsis of urinary source initiated - followup on lactic acid levels and consider discontinuation of IV antibiotics with a switch to oral antibiotic, UTI as needed in the morning - doubts sepsis syndrome as cause of afebrile illness.  2. Dehydration:   Due to poor oral intake - careful IV rehydration with monitoring of renal function - watch for fluid overload.  3. Acute renal insufficiency:  Likely due to diagnosis #1 and 2 - expect improvement with rehydration.  Follow clinically.  4. Borderline hypotension:  Doubt sepsis syndrome.  Follow-up on  Serial troponins - possible false-positivity to renal dysfunction. Check 12-lead EKG  5. UTI: Antibiotic. DVT prophylaxis:  (Lovenox/) Code Status:  (Full) Family Communication:Patricia Drue Flirt (Daughter, tel # [phone]) Disposition Plan:Home Consults called: N/A Admission status: (inpatient  tele)   OSEI-BONSU,Julis Haubner MD [facility] Pager [phone]  If 7PM-7AM, please contact night-coverage [email or web address] Password [facility]  12/08/2016, 6:58 PM

## 2016-12-08 NOTE — ED Triage Notes (Signed)
Daughter stated, she's not been feeling good for 2 days, cough and today was the 1st time for her temperature. Daughter stated, I ttok her temp and it was 102.7. Given her Ibuprofen 200mg . At 1200 with a couple of sips of water. Not able to eat or drink for the last couple of days. She ate yesterday around 300pm yesterday . No N/V

## 2016-12-08 NOTE — ED Notes (Signed)
Patient returned from radiology and was slow to respond and diaphoretic. Pulled back into triage for quick assessment and Dr. Jeanell Sparrow at bedside for evaluation. Patient answered questions appropriately and negative for neuro deficits

## 2016-12-09 ENCOUNTER — Other Ambulatory Visit (HOSPITAL_COMMUNITY): Payer: Medicare Other

## 2016-12-09 ENCOUNTER — Inpatient Hospital Stay (HOSPITAL_COMMUNITY): Payer: Medicare Other

## 2016-12-09 DIAGNOSIS — N179 Acute kidney failure, unspecified: Secondary | ICD-10-CM

## 2016-12-09 DIAGNOSIS — E860 Dehydration: Secondary | ICD-10-CM

## 2016-12-09 DIAGNOSIS — J111 Influenza due to unidentified influenza virus with other respiratory manifestations: Secondary | ICD-10-CM

## 2016-12-09 DIAGNOSIS — A419 Sepsis, unspecified organism: Principal | ICD-10-CM

## 2016-12-09 DIAGNOSIS — I472 Ventricular tachycardia: Secondary | ICD-10-CM

## 2016-12-09 LAB — COMPREHENSIVE METABOLIC PANEL
ALBUMIN: 2.7 g/dL — AB (ref 3.5–5.0)
ALK PHOS: 58 U/L (ref 38–126)
ALT: 11 U/L — ABNORMAL LOW (ref 14–54)
ANION GAP: 5 (ref 5–15)
AST: 16 U/L (ref 15–41)
BUN: 13 mg/dL (ref 6–20)
CALCIUM: 8.3 mg/dL — AB (ref 8.9–10.3)
CO2: 21 mmol/L — ABNORMAL LOW (ref 22–32)
Chloride: 111 mmol/L (ref 101–111)
Creatinine, Ser: 0.9 mg/dL (ref 0.44–1.00)
GFR calc Af Amer: >60 mL/min (ref >60)
GFR, EST NON AFRICAN AMERICAN: 55 mL/min — AB (ref >60)
GLUCOSE: 91 mg/dL (ref 65–99)
POTASSIUM: 4.2 mmol/L (ref 3.5–5.1)
Sodium: 137 mmol/L (ref 135–145)
Total Bilirubin: 0.8 mg/dL (ref 0.3–1.2)
Total Protein: 5.4 g/dL — ABNORMAL LOW (ref 6.5–8.1)

## 2016-12-09 LAB — URINE CULTURE

## 2016-12-09 LAB — CBC
HEMATOCRIT: 36.5 % (ref 36.0–46.0)
Hemoglobin: 11.9 g/dL — ABNORMAL LOW (ref 12.0–15.0)
MCH: 30.6 pg (ref 26.0–34.0)
MCHC: 32.6 g/dL (ref 30.0–36.0)
MCV: 93.8 fL (ref 78.0–100.0)
PLATELETS: 107 10*3/uL — AB (ref 150–400)
RBC: 3.89 MIL/uL (ref 3.87–5.11)
RDW: 14.8 % (ref 11.5–15.5)
WBC: 8.1 10*3/uL (ref 4.0–10.5)

## 2016-12-09 LAB — TSH: TSH: 0.76 u[IU]/mL (ref 0.350–4.500)

## 2016-12-09 LAB — TROPONIN I
TROPONIN I: 0.11 ng/mL — AB (ref <0.03)
Troponin I: <0.03 ng/mL (ref <0.03)

## 2016-12-09 LAB — LACTIC ACID, PLASMA
LACTIC ACID, VENOUS: 0.6 mmol/L (ref 0.5–1.9)
Lactic Acid, Venous: 1.1 mmol/L (ref 0.5–1.9)
Lactic Acid, Venous: 1.3 mmol/L (ref 0.5–1.9)

## 2016-12-09 LAB — GLUCOSE, CAPILLARY: GLUCOSE-CAPILLARY: 91 mg/dL (ref 65–99)

## 2016-12-09 MED ORDER — OSELTAMIVIR PHOSPHATE 30 MG PO CAPS
30.0000 mg | ORAL_CAPSULE | Freq: Two times a day (BID) | ORAL | Status: DC
  Administered 2016-12-09 – 2016-12-10 (×2): 30 mg via ORAL
  Filled 2016-12-09 (×3): qty 1

## 2016-12-09 MED ORDER — POTASSIUM CHLORIDE IN NACL 20-0.9 MEQ/L-% IV SOLN
INTRAVENOUS | Status: AC
  Administered 2016-12-09: 20:00:00 via INTRAVENOUS
  Filled 2016-12-09: qty 1000

## 2016-12-09 MED ORDER — CARVEDILOL 3.125 MG PO TABS
3.1250 mg | ORAL_TABLET | Freq: Two times a day (BID) | ORAL | Status: DC
  Administered 2016-12-09 – 2016-12-10 (×2): 3.125 mg via ORAL
  Filled 2016-12-09 (×2): qty 1

## 2016-12-09 NOTE — Progress Notes (Signed)
Pt had 15 beats run of V-TACH, pt states she was doing good and no chest pain when assessed. Discussed with MD in person, he ordered echocardiogram for pt. Will continue to monitor.

## 2016-12-09 NOTE — Progress Notes (Signed)
CRITICAL VALUE ALERT  Critical value received:  Troponin 0.11  Date of notification:  12/09/2016  Time of notification: [id number]  Critical value read back:Yes.    Nurse who received alert:  Ireti  MD notified (1st page): Hongalgi   Time of first page:  [id number]

## 2016-12-09 NOTE — Progress Notes (Signed)
PROGRESS NOTE  Toni Hayes  ULA:[id number] DOB: [birth year]  DOA: 12/08/2016 PCP: Horatio Pel, MD   Brief Narrative:  83 year old female with PMH of HTN, GERD, HLD, abnormal EKG (incomplete RBBB & LAFB) presented to ED with 2 days history of generalized weakness, decreased appetite, rhinorrhea, cough productive of clear sputum followed by fever of up to 102F. No relief with Tylenol. Some nausea and dizziness. In the ED, she apparently met sepsis criteria and was initiated on IV fluids, IV vancomycin and Zosyn.   Assessment & Plan:   Active Problems:   Influenza-like illness   Fever   Flu syndrome   1. Febrile illness/early sepsis: Met sepsis criteria on admission. Treated with IV fluids and started empirically on IV vancomycin, Zosyn and Tamiflu. Influenza A PCR positive. Influenza B PCR negative. Urine microscopy not suggestive of UTI and patient lacks urinary symptoms. Blood cultures 2: Negative to date. Urine culture suggests contamination. Chest x-ray 12/08/16 without acute findings. Clinically improved. Repeat chest x-ray and if no pneumonia, DC antibiotics. Pro-calcitonin 0.15 2. Influenza A with respiratory manifestations: Supportive treatment. 3. Dehydration: Brief IV fluids. 4. Mild acute kidney injury: Secondary to sepsis and dehydration. Resolved. 5. Mildly elevated troponin: <0.03 > 0.11 > less than 0.03. Has normalized. Never complained of chest pain. EKG without acute changes. Likely related to sepsis physiology. 6. NSVT: Noted an episode of asymptomatic 16 beat NSVT on 12/10/15. Continue telemetry. Potassium 4.2. Magnesium 1.9. Check 2-D echo. TSH 0.760. Discussed with cardiologist on call and recommended carvedilol 3.125 MG twice a day. Monitor. 7. Anemia: Follow CBCs. 8. Thrombocytopenia: May be related to influenza and acute illness. Follow CBCs. 9. Hyperlipidemia: Continue statins. 10. GERD 11. Essential hypertension: Not on antihypertensives prior to admission.  Now on carvedilol for NSVT. Monitor.   DVT prophylaxis: Lovenox Code Status: Full Family Communication: None at bedside Disposition Plan: DC home when medically stable, possibly in the next 1-2 days.   Consultants:   None  Procedures:   None  Antimicrobials:   IV vancomycin and Zosyn  Oral Tamiflu    Subjective: Feels better. Denies fever, pains, chest pain, dyspnea or palpitations. Chest feels congested with mild intermittent dry cough. Denies any other complaints. Denies history of heart disease.  Objective:  Vitals:   12/08/16 2150 12/09/16 0457 12/09/16 1331 12/09/16 1504  BP: (!) 107/50 (!) 102/42 (!) 109/93 (!) 109/56  Pulse: 62 (!) 58 66 65  Resp: _0 Temp: 98.6 F (37 C) 100.1 F (37.8 C)  99.7 F (37.6 C)  TempSrc: Oral Oral  Oral  SpO2: 96% 95%  96%  Weight:      Height:        Intake/Output Summary (Last 24 hours) at 12/09/16 1603 Last data filed at 12/09/16 1000  Gross per 24 hour  Intake             3625 ml  Output                0 ml  Net             3625 ml   Filed Weights   12/08/16 1452  Weight: 63.5 kg (140 lb)    Examination:  General exam: Pleasant elderly female lying comfortably supine in bed. Does not look septic or toxic. Respiratory system: Slightly harsh breath sounds but no wheezing, crackles or rhonchi. Respiratory effort normal. Cardiovascular system: S1 & S2 heard, RRR. No JVD, murmurs, rubs, gallops or clicks. No pedal edema.  Telemetry: Sinus rhythm. 16 beat NSVT noted on 12/09/16 at 9:53 AM. Gastrointestinal system: Abdomen is nondistended, soft and nontender. No organomegaly or masses felt. Normal bowel sounds heard. Central nervous system: Alert and oriented. No focal neurological deficits. Extremities: Symmetric 5 x 5 power. Skin: No rashes, lesions or ulcers Psychiatry: Judgement and insight appear normal. Mood & affect appropriate.     Data Reviewed: I have personally reviewed following labs and imaging  studies  CBC:  Recent Labs Lab 12/08/16 1456 12/09/16 0550  WBC 11.7* 8.1  NEUTROABS 9.2*  --   HGB 15.5* 11.9*  HCT 45.8 36.5  MCV 91.8 93.8  PLT 183 675*   Basic Metabolic Panel:  Recent Labs Lab 12/08/16 1456 12/08/16 2131 12/09/16 0829  NA 137  --  137  K 4.2  --  4.2  CL 104  --  111  CO2 23  --  21*  GLUCOSE 141*  --  91  BUN 14  --  13  CREATININE 1.20*  --  0.90  CALCIUM 9.4  --  8.3*  MG  --  1.9  --   PHOS  --  3.5  --    GFR: Estimated Creatinine Clearance: 33.6 mL/min (by C-G formula based on SCr of 0.9 mg/dL). Liver Function Tests:  Recent Labs Lab 12/08/16 1456 12/09/16 0829  AST 18 16  ALT 11* 11*  ALKPHOS 89 58  BILITOT 0.9 0.8  PROT 7.2 5.4*  ALBUMIN 3.5 2.7*   No results for input(s): LIPASE, AMYLASE in the last 168 hours. No results for input(s): AMMONIA in the last 168 hours. Coagulation Profile:  Recent Labs Lab 12/08/16 2131  INR 1.17   Cardiac Enzymes:  Recent Labs Lab 12/08/16 2131 12/09/16 0550 12/09/16 0829  TROPONINI <0.03 0.11* <0.03   BNP (last 3 results) No results for input(s): PROBNP in the last 8760 hours. HbA1C: No results for input(s): HGBA1C in the last 72 hours. CBG:  Recent Labs Lab 12/09/16 0809  GLUCAP 91   Lipid Profile: No results for input(s): CHOL, HDL, LDLCALC, TRIG, CHOLHDL, LDLDIRECT in the last 72 hours. Thyroid Function Tests:  Recent Labs  12/09/16 1102  TSH 0.760   Anemia Panel: No results for input(s): VITAMINB12, FOLATE, FERRITIN, TIBC, IRON, RETICCTPCT in the last 72 hours.  Sepsis Labs:  Recent Labs Lab 12/08/16 1456  12/08/16 2131 12/08/16 2204 12/08/16 2356 12/09/16 0550 12/09/16 0621 12/09/16 0829  PROCALCITON  --   --  0.15  --   --   --   --   --   WBC 11.7*  --   --   --   --  8.1  --   --   LATICACIDVEN  --   < >  --  1.3 1.1  --  1.3 0.6  < > = values in this interval not displayed.  Recent Results (from the past 240 hour(s))  Blood Culture (routine  x 2)     Status: None (Preliminary result)   Collection Time: 12/08/16  4:32 PM  Result Value Ref Range Status   Specimen Description BLOOD RIGHT ANTECUBITAL  Final   Special Requests BOTTLES DRAWN AEROBIC AND ANAEROBIC 5CC  Final   Culture NO GROWTH < 24 HOURS  Final   Report Status PENDING  Incomplete  Blood Culture (routine x 2)     Status: None (Preliminary result)   Collection Time: 12/08/16  4:37 PM  Result Value Ref Range Status   Specimen Description BLOOD LEFT FOREARM  Final   Special Requests BOTTLES DRAWN AEROBIC AND ANAEROBIC 5CC  Final   Culture NO GROWTH < 24 HOURS  Final   Report Status PENDING  Incomplete  Urine culture     Status: Abnormal   Collection Time: 12/08/16  5:08 PM  Result Value Ref Range Status   Specimen Description URINE, RANDOM  Final   Special Requests NONE  Final   Culture MULTIPLE SPECIES PRESENT, SUGGEST RECOLLECTION (A)  Final   Report Status 12/09/2016 FINAL  Final         Radiology Studies: Dg Chest 2 View  Result Date: 12/08/2016 CLINICAL DATA:  Cough, congestion and slight fever. EXAM: CHEST  2 VIEW COMPARISON:  08/01/2012 FINDINGS: Cardiac silhouette is mildly enlarged. There is a moderate size hiatal hernia. No mediastinal or hilar masses. No convincing adenopathy. Lungs are clear.  No pleural effusion.  No pneumothorax. Skeletal structures are demineralized but grossly intact. IMPRESSION: No acute cardiopulmonary disease. Electronically Signed   By: Lajean Manes M.D.   On: 12/08/2016 15:34        Scheduled Meds: . acidophilus   Oral Daily  . calcium-vitamin D  1 tablet Oral BID WC  . carvedilol  3.125 mg Oral BID WC  . cholecalciferol  1,000 Units Oral Daily  . enoxaparin (LOVENOX) injection  30 mg Subcutaneous Q24H  . multivitamin with minerals  1 tablet Oral Daily  . oseltamivir  30 mg Oral BID  . piperacillin-tazobactam (ZOSYN)  IV  3.375 g Intravenous Q8H  . pravastatin  20 mg Oral QHS  . sodium chloride flush  3 mL  Intravenous Q12H  . vancomycin  750 mg Intravenous Q24H  . vitamin B-12  50 mcg Oral Daily   Continuous Infusions: . 0.9 % NaCl with KCl 20 mEq / L 125 mL/hr at 12/09/16 0833     LOS: 1 day       [facility], MD [facility] Pager [phone]  If 7PM-7AM, please contact night-coverage [email or web address] Password [facility] 12/09/2016, 4:03 PM

## 2016-12-10 ENCOUNTER — Inpatient Hospital Stay (HOSPITAL_COMMUNITY): Payer: Medicare Other

## 2016-12-10 DIAGNOSIS — R000 Tachycardia, unspecified: Secondary | ICD-10-CM

## 2016-12-10 LAB — ECHOCARDIOGRAM COMPLETE
HEIGHTINCHES: 59 in
WEIGHTICAEL: 2240 [oz_av]

## 2016-12-10 LAB — CBC
HCT: 38.4 % (ref 36.0–46.0)
Hemoglobin: 12.2 g/dL (ref 12.0–15.0)
MCH: 29.8 pg (ref 26.0–34.0)
MCHC: 31.8 g/dL (ref 30.0–36.0)
MCV: 93.9 fL (ref 78.0–100.0)
PLATELETS: 148 10*3/uL — AB (ref 150–400)
RBC: 4.09 MIL/uL (ref 3.87–5.11)
RDW: 14.6 % (ref 11.5–15.5)
WBC: 5.7 10*3/uL (ref 4.0–10.5)

## 2016-12-10 LAB — HEMOGLOBIN A1C
Hgb A1c MFr Bld: 5.6 % (ref 4.8–5.6)
MEAN PLASMA GLUCOSE: 114 mg/dL

## 2016-12-10 MED ORDER — OSELTAMIVIR PHOSPHATE 30 MG PO CAPS: 30.0000 mg | ORAL_CAPSULE | Freq: Two times a day (BID) | ORAL | 0 refills | Status: DC

## 2016-12-10 MED ORDER — CARVEDILOL 3.125 MG PO TABS: 3.1250 mg | ORAL_TABLET | Freq: Two times a day (BID) | ORAL | 0 refills | Status: DC

## 2016-12-10 NOTE — Progress Notes (Signed)
  Echocardiogram 2D Echocardiogram has been performed.  Toni Hayes 12/10/2016, 10:27 AM

## 2016-12-10 NOTE — Discharge Summary (Signed)
Physician Discharge Summary  Toni Hayes ACZ:[id number] DOB: [birth year]  PCP: Horatio Pel, MD  Admit date: 12/08/2016 Discharge date: 12/10/2016  Recommendations for Outpatient Follow-up:  1. Dr. Deland Pretty, PCP in 4 days with repeat labs (CBC & BMP). Please follow final blood culture results that were sent from the hospital. 2. Recommend repeating chest x-ray in 3-4 weeks to ensure resolution of atelectasis that was seen on chest x-ray.  Home Health: None Equipment/Devices: None    Discharge Condition: Improved and stable  CODE STATUS: Full  Diet recommendation: Heart healthy diet.  Discharge Diagnoses:  Active Problems:   Influenza-like illness   Fever   Flu syndrome   Brief/Interim Summary: 83 year old female with PMH of HTN, GERD, HLD, abnormal EKG (incomplete RBBB & LAFB) presented to ED with 2 days history of generalized weakness, decreased appetite, rhinorrhea, cough productive of clear sputum followed by fever of up to 102F. No relief with Tylenol. Some nausea and dizziness. In the ED, she apparently met sepsis criteria and was initiated on IV fluids, IV vancomycin and Zosyn.   Assessment & Plan:   1. Febrile illness/early sepsis: Met sepsis criteria on admission. Treated with IV fluids and started empirically on IV vancomycin, Zosyn and Tamiflu. Influenza A PCR positive. Influenza B PCR negative. Urine microscopy not suggestive of UTI and patient lacks urinary symptoms. Blood cultures 2: Negative to date. Urine culture suggests contamination. Chest x-ray 12/08/16 without acute findings. Clinically improved. Repeat chest x-ray 1/14 showed no interval change and mild bibasilar atelectasis and tiny right effusion. Pro-calcitonin 0.15. Discontinued all antibiotics. Clinically improved. 2. Influenza A with respiratory manifestations: Supportive treatment. Complete total of 5 days course of dose adjusted Tamiflu. 3. Dehydration: Resolved after IV fluids. 4. Mild  acute kidney injury: Secondary to sepsis and dehydration. Resolved. 5. Mildly elevated troponin: <0.03 > 0.11 > less than 0.03. Has normalized. Never complained of chest pain. EKG without acute changes. Likely related to sepsis physiology. 2-D echo results as below and normal EF. 6. NSVT/NSSVT: Noted an episode of asymptomatic 16 beat NSVT on 12/10/15. Monitored on telemetry. Potassium 4.2. Magnesium 1.9. TSH 0.760. Discussed with cardiologist on call on 12/09/16 who recommended carvedilol 3.125 MG twice a day. 2-D echo: LVEF 65-70 percent. No further episodes of NSVT. Had transient 14 beat episode of NSSVT on 12/09/16 night. All likely precipitated by acute viral illness. Follow up as outpatient. 7. Anemia:  stable. 8. Thrombocytopenia: May be related to influenza and acute illness. Improving. Follow CBCs in a few days as outpatient. 9. Hyperlipidemia:  as per home medication reconciliation, patient does not take statins. 10. GERD 11. Essential hypertension: Not on antihypertensives prior to admission. Now on carvedilol for NSVT. Controlled.   Consultants:   None  Procedures:   2-D echo 12/10/16: Study Conclusions  - Left ventricle: The cavity size was normal. Wall thickness was   increased in a pattern of mild LVH. Systolic function was   vigorous. The estimated ejection fraction was in the range of 65%   to 70%. Doppler parameters are consistent with abnormal left   ventricular relaxation (grade 1 diastolic dysfunction). - Aortic valve: There was mild regurgitation. - Right atrium: The atrium was mildly dilated.   Discharge Instructions  Discharge Instructions    Call MD for:  difficulty breathing, headache or visual disturbances    Complete by:  As directed    Call MD for:  extreme fatigue    Complete by:  As directed    Call MD for:  persistant dizziness or light-headedness    Complete by:  As directed    Call MD for:  severe uncontrolled pain    Complete by:  As directed     Call MD for:  temperature >100.4    Complete by:  As directed    Diet - low sodium heart healthy    Complete by:  As directed    Increase activity slowly    Complete by:  As directed        Medication List    STOP taking these medications   amLODipine 5 MG tablet Commonly known as:  NORVASC   desoximetasone 0.05 % cream Commonly known as:  TOPICORT   pravastatin 20 MG tablet Commonly known as:  PRAVACHOL   TANDEM 162-115.2 MG Caps capsule Generic drug:  ferrous fumarate-iron polysaccharide complex     TAKE these medications   acetaminophen 500 MG tablet Commonly known as:  TYLENOL Take 500 mg by mouth every 6 (six) hours as needed for mild pain.   AMBULATORY NON FORMULARY MEDICATION Medication Name: [employer] Flex Support: once daily   CALCIUM 500 PO Take 1,200 mg by mouth 2 (two) times daily.   carvedilol 3.125 MG tablet Commonly known as:  COREG Take 1 tablet (3.125 mg total) by mouth 2 (two) times daily with a meal.   multivitamin tablet Take 1 tablet by mouth daily.   oseltamivir 30 MG capsule Commonly known as:  TAMIFLU Take 1 capsule (30 mg total) by mouth 2 (two) times daily.   PROBIOTIC DAILY PO Take by mouth daily.   STOOL SOFTENER PO Take by mouth as needed.   vitamin B-12 100 MCG tablet Commonly known as:  CYANOCOBALAMIN Take 50 mcg by mouth daily.   VITAMIN D-400 PO Take by mouth daily.      Follow-up Information    Horatio Pel, MD. Schedule an appointment as soon as possible for a visit in 4 day(s).   Specialty:  Internal Medicine Why:  To be seen with repeat labs (CBC & BMP). Contact information: [address] [place] [address] [phone]          Allergies  Allergen Reactions  . Codeine     rash  . Meperidine Rash    Procedures/Studies: Dg Chest 2 View  Result Date: 12/09/2016 CLINICAL DATA:  Productive cough EXAM: CHEST  2 VIEW COMPARISON:  December 08, 2016 FINDINGS: Stable  cardiomediastinal silhouette. Mild bibasilar atelectasis and probable tiny right effusion. Large hiatal hernia. No other interval changes or acute abnormalities. IMPRESSION: No interval change. Mild bibasilar atelectasis and tiny right effusion. Electronically Signed   By: Dorise Bullion III M.D   On: 12/09/2016 17:07   Dg Chest 2 View  Result Date: 12/08/2016 CLINICAL DATA:  Cough, congestion and slight fever. EXAM: CHEST  2 VIEW COMPARISON:  08/01/2012 FINDINGS: Cardiac silhouette is mildly enlarged. There is a moderate size hiatal hernia. No mediastinal or hilar masses. No convincing adenopathy. Lungs are clear.  No pleural effusion.  No pneumothorax. Skeletal structures are demineralized but grossly intact. IMPRESSION: No acute cardiopulmonary disease. Electronically Signed   By: Lajean Manes M.D.   On: 12/08/2016 15:34      Subjective: Patient states that she feels much better. Denies dyspnea, cough, body aches, fever, chills, dizziness or lightheadedness. Appetite improved. Chest congestion improved. As per RN, no acute issues.  Discharge Exam:  Vitals:   12/10/16 0057 12/10/16 0103 12/10/16 0509 12/10/16 0907  BP: (!) 124/45  Marland Kitchen)  109/50 127/72  Pulse: 65  60 75  Resp: 18  18   Temp: (!) 100.5 F (38.1 C) 98.6 F (37 C) 99.9 F (37.7 C)   TempSrc: Oral Oral Oral   SpO2: 96%  96%   Weight:      Height:        General exam: Pleasant elderly female lying comfortably supine in bed. Does not look septic or toxic. Respiratory system: Clear to auscultation. Respiratory effort normal. Cardiovascular system: S1 & S2 heard, RRR. No JVD, murmurs, rubs, gallops or clicks. No pedal edema. Telemetry: Sinus rhythm. 16 beat NSVT noted on 12/09/16 at 9:53 AM. No further episodes of NSVT. 14 beat NSSVT on 12/09/16 night. Gastrointestinal system: Abdomen is nondistended, soft and nontender. No organomegaly or masses felt. Normal bowel sounds heard. Central nervous system: Alert and oriented. No  focal neurological deficits. Extremities: Symmetric 5 x 5 power. Skin: No rashes, lesions or ulcers Psychiatry: Judgement and insight appear normal. Mood & affect appropriate.     The results of significant diagnostics from this hospitalization (including imaging, microbiology, ancillary and laboratory) are listed below for reference.     Microbiology: Recent Results (from the past 240 hour(s))  Blood Culture (routine x 2)     Status: None (Preliminary result)   Collection Time: 12/08/16  4:32 PM  Result Value Ref Range Status   Specimen Description BLOOD RIGHT ANTECUBITAL  Final   Special Requests BOTTLES DRAWN AEROBIC AND ANAEROBIC 5CC  Final   Culture NO GROWTH 2 DAYS  Final   Report Status PENDING  Incomplete  Blood Culture (routine x 2)     Status: None (Preliminary result)   Collection Time: 12/08/16  4:37 PM  Result Value Ref Range Status   Specimen Description BLOOD LEFT FOREARM  Final   Special Requests BOTTLES DRAWN AEROBIC AND ANAEROBIC 5CC  Final   Culture NO GROWTH 2 DAYS  Final   Report Status PENDING  Incomplete  Urine culture     Status: Abnormal   Collection Time: 12/08/16  5:08 PM  Result Value Ref Range Status   Specimen Description URINE, RANDOM  Final   Special Requests NONE  Final   Culture MULTIPLE SPECIES PRESENT, SUGGEST RECOLLECTION (A)  Final   Report Status 12/09/2016 FINAL  Final     Labs: BNP (last 3 results) No results for input(s): BNP in the last 8760 hours. Basic Metabolic Panel:  Recent Labs Lab 12/08/16 1456 12/08/16 2131 12/09/16 0829  NA 137  --  137  K 4.2  --  4.2  CL 104  --  111  CO2 23  --  21*  GLUCOSE 141*  --  91  BUN 14  --  13  CREATININE 1.20*  --  0.90  CALCIUM 9.4  --  8.3*  MG  --  1.9  --   PHOS  --  3.5  --    Liver Function Tests:  Recent Labs Lab 12/08/16 1456 12/09/16 0829  AST 18 16  ALT 11* 11*  ALKPHOS 89 58  BILITOT 0.9 0.8  PROT 7.2 5.4*  ALBUMIN 3.5 2.7*   CBC:  Recent Labs Lab  12/08/16 1456 12/09/16 0550 12/10/16 0343  WBC 11.7* 8.1 5.7  NEUTROABS 9.2*  --   --   HGB 15.5* 11.9* 12.2  HCT 45.8 36.5 38.4  MCV 91.8 93.8 93.9  PLT 183 107* 148*   Cardiac Enzymes:  Recent Labs Lab 12/08/16 2131 12/09/16 0550 12/09/16 0829  TROPONINI <  0.03 0.11* <0.03   CBG:  Recent Labs Lab 12/09/16 0809  GLUCAP 91   Hgb A1c  Recent Labs  12/08/16 2131  HGBA1C 5.6   Thyroid function studies  Recent Labs  12/09/16 1102  TSH 0.760   Urinalysis    Component Value Date/Time   COLORURINE YELLOW 12/08/2016 1708   APPEARANCEUR HAZY (A) 12/08/2016 1708   LABSPEC >1.030 (H) 12/08/2016 1708   PHURINE 6.0 12/08/2016 1708   GLUCOSEU NEGATIVE 12/08/2016 1708   HGBUR SMALL (A) 12/08/2016 1708   HGBUR large 11/03/2010 1523   BILIRUBINUR SMALL (A) 12/08/2016 1708   KETONESUR 15 (A) 12/08/2016 1708   PROTEINUR 30 (A) 12/08/2016 1708   UROBILINOGEN negative 11/03/2010 1523   NITRITE NEGATIVE 12/08/2016 1708   LEUKOCYTESUR SMALL (A) 12/08/2016 1708   Discussed in detail with patient's daughter. Updated care and answered questions. Daughter stays with patient and provides care and supervision.   Time coordinating discharge: Over 30 minutes  SIGNED:  Vernell Leep, MD, FACP, [place]. [facility] Pager [phone]  If 7PM-7AM, please contact night-coverage [email or web address] Password [email or web address] 12/10/2016, 1:34 PM

## 2016-12-10 NOTE — Care Management Note (Addendum)
Case Management Note  Patient Details  Name: Toni Hayes MRN: [id number] Date of Birth: 11/08/1924  Subjective/Objective:    Pt with PMH of HTN, GERD, HLD, abnormal EKG  presented to ED with 2 days history of generalized weakness, decreased appetite, rhinorrhea, and cough productive of clear sputum followed by fever of up to 102F. From home with daughter. Admitted with influenza -like illness/sepsis. Resides with daughter,Patricia. Pt states independent with ADL's and no DME usage PTA.  [facility])     [phone]      PCP: Deland Pretty  Action/Plan: Plan is to d/c today, echo pending and PT evaluation.  Expected Discharge Date:   12/10/2016            Expected Discharge Plan:  Home/Self Care  In-House Referral:     Discharge planning Services  CM Consult  Post Acute Care Choice:    Choice offered to:     DME Arranged:    DME Agency:     HH Arranged:    HH Agency:     Status of Service:  completed  If discussed at [employer] of Stay Meetings, dates discussed:    Additional Comments:  Sharin Mons, RN 12/10/2016, 9:51 AM

## 2016-12-10 NOTE — Discharge Instructions (Addendum)
Influenza, Adult Influenza, more commonly known as the flu, is a viral infection that primarily affects the respiratory tract. The respiratory tract includes organs that help you breathe, such as the lungs, nose, and throat. The flu causes many common cold symptoms, as well as a high fever and body aches. The flu spreads easily from person to person (is contagious). Getting a flu shot (influenza vaccination) every year is the best way to prevent influenza. What are the causes? Influenza is caused by a virus. You can catch the virus by:  Breathing in droplets from an infected person's cough or sneeze.  Touching something that was recently contaminated with the virus and then touching your mouth, nose, or eyes. What increases the risk? The following factors may make you more likely to get the flu:  Not cleaning your hands frequently with soap and water or alcohol-based hand sanitizer.  Having close contact with many people during cold and flu season.  Touching your mouth, eyes, or nose without washing or sanitizing your hands first.  Not drinking enough fluids or not eating a healthy diet.  Not getting enough sleep or exercise.  Being under a high amount of stress.  Not getting a yearly (annual) flu shot. You may be at a higher risk of complications from the flu, such as a severe lung infection (pneumonia), if you:  Are over the age of 49.  Are pregnant.  Have a weakened disease-fighting system (immune system). You may have a weakened immune system if you:  Have HIV or AIDS.  Are undergoing chemotherapy.  Aretaking medicines that reduce the activity of (suppress) the immune system.  Have a long-term (chronic) illness, such as heart disease, kidney disease, diabetes, or lung disease.  Have a liver disorder.  Are obese.  Have anemia. What are the signs or symptoms? Symptoms of this condition typically last 4-10 days and may include:  Fever.  Chills.  Headache, body  aches, or muscle aches.  Sore throat.  Cough.  Runny or congested nose.  Chest discomfort and cough.  Poor appetite.  Weakness or tiredness (fatigue).  Dizziness.  Nausea or vomiting. How is this diagnosed? This condition may be diagnosed based on your medical history and a physical exam. Your health care provider may do a nose or throat swab test to confirm the diagnosis. How is this treated? If influenza is detected early, you can be treated with antiviral medicine that can reduce the length of your illness and the severity of your symptoms. This medicine may be given by mouth (orally) or through an IV tube that is inserted in one of your veins. The goal of treatment is to relieve symptoms by taking care of yourself at home. This may include taking over-the-counter medicines, drinking plenty of fluids, and adding humidity to the air in your home. In some cases, influenza goes away on its own. Severe influenza or complications from influenza may be treated in a hospital. Follow these instructions at home:  Take over-the-counter and prescription medicines only as told by your health care provider.  Use a cool mist humidifier to add humidity to the air in your home. This can make breathing easier.  Rest as needed.  Drink enough fluid to keep your urine clear or pale yellow.  Cover your mouth and nose when you cough or sneeze.  Wash your hands with soap and water often, especially after you cough or sneeze. If soap and water are not available, use hand sanitizer.  Stay home from  work or school as told by your health care provider. Unless you are visiting your health care provider, try to avoid leaving home until your fever has been gone for 24 hours without the use of medicine.  Keep all follow-up visits as told by your health care provider. This is important. How is this prevented?  Getting an annual flu shot is the best way to avoid getting the flu. You may get the flu shot  in late summer, fall, or winter. Ask your health care provider when you should get your flu shot.  Wash your hands often or use hand sanitizer often.  Avoid contact with people who are sick during cold and flu season.  Eat a healthy diet, drink plenty of fluids, get enough sleep, and exercise regularly. Contact a health care provider if:  You develop new symptoms.  You have:  Chest pain.  Diarrhea.  A fever.  Your cough gets worse.  You produce more mucus.  You feel nauseous or you vomit. Get help right away if:  You develop shortness of breath or difficulty breathing.  Your skin or nails turn a bluish color.  You have severe pain or stiffness in your neck.  You develop a sudden headache or sudden pain in your face or ear.  You cannot stop vomiting. This information is not intended to replace advice given to you by your health care provider. Make sure you discuss any questions you have with your health care provider. Document Released: 11/09/2000 Document Revised: 04/19/2016 Document Reviewed: 09/06/2015 Elsevier Interactive Patient Education  2017 [place].   Additional discharge instructions:   Please get your medications reviewed and adjusted by your Primary MD.  Please request your Primary MD to go over all Hospital Tests and Procedure/Radiological results at the follow up, please get all Hospital records sent to your Prim MD by signing hospital release before you go home.  If you had Pneumonia of Lung problems at the Hospital: Please get a 2 view Chest X ray done in 6-8 weeks after hospital discharge or sooner if instructed by your Primary MD.  If you have Congestive Heart Failure: Please call your Cardiologist or Primary MD anytime you have any of the following symptoms:  1) 3 pound weight gain in 24 hours or 5 pounds in 1 week  2) shortness of breath, with or without a dry hacking cough  3) swelling in the hands, feet or stomach  4) if you have to  sleep on extra pillows at night in order to breathe  Follow cardiac low salt diet and 1.5 lit/day fluid restriction.  If you have diabetes Accuchecks 4 times/day, Once in AM empty stomach and then before each meal. Log in all results and show them to your primary doctor at your next visit. If any glucose reading is under 80 or above 300 call your primary MD immediately.  If you have Seizure/Convulsions/Epilepsy: Please do not drive, operate heavy machinery, participate in activities at heights or participate in high speed sports until you have seen by Primary MD or a Neurologist and advised to do so again.  If you had Gastrointestinal Bleeding: Please ask your Primary MD to check a complete blood count within one week of discharge or at your next visit. Your endoscopic/colonoscopic biopsies that are pending at the time of discharge, will also need to followed by your Primary MD.  Get Medicines reviewed and adjusted. Please take all your medications with you for your next visit with your Primary  MD  Please request your Primary MD to go over all hospital tests and procedure/radiological results at the follow up, please ask your Primary MD to get all Hospital records sent to his/her office.  If you experience worsening of your admission symptoms, develop shortness of breath, life threatening emergency, suicidal or homicidal thoughts you must seek medical attention immediately by calling 911 or calling your MD immediately  if symptoms less severe.  You must read complete instructions/literature along with all the possible adverse reactions/side effects for all the Medicines you take and that have been prescribed to you. Take any new Medicines after you have completely understood and accpet all the possible adverse reactions/side effects.   Do not drive or operate heavy machinery when taking Pain medications.   Do not take more than prescribed Pain, Sleep and Anxiety Medications  Special  Instructions: If you have smoked or chewed Tobacco  in the last 2 yrs please stop smoking, stop any regular Alcohol  and or any Recreational drug use.  Wear Seat belts while driving.  Please note You were cared for by a hospitalist during your hospital stay. If you have any questions about your discharge medications or the care you received while you were in the hospital after you are discharged, you can call the unit and asked to speak with the hospitalist on call if the hospitalist that took care of you is not available. Once you are discharged, your primary care physician will handle any further medical issues. Please note that NO REFILLS for any discharge medications will be authorized once you are discharged, as it is imperative that you return to your primary care physician (or establish a relationship with a primary care physician if you do not have one) for your aftercare needs so that they can reassess your need for medications and monitor your lab values.  You can reach the hospitalist office at phone [phone] or fax [phone]   If you do not have a primary care physician, you can call [phone] for a physician referral.

## 2016-12-10 NOTE — Evaluation (Signed)
Physical Therapy Evaluation Patient Details Name: Toni Hayes MRN: [id number] DOB: June 28, 1924 Today's Date: 12/10/2016   History of Present Illness  Toni Hayes is a 83 y.o. female who presented to the [facility] ED with 2 days history of generealised weakness, loss of appetite, clear nasal congestion with cough productive of clear sputum followed by fever of upto 102 today.Marland Kitchen Pt positive for flu.  Clinical Impression  Pt functioning near baseline. Pt with mild deconditioning and impaired balance but has good home set up and support. Pt functioning at supervision/min guard level. Acute PT to follow.    Follow Up Recommendations No PT follow up;Supervision/Assistance - 24 hour    Equipment Recommendations  None recommended by PT    Recommendations for Other Services       Precautions / Restrictions Precautions Precautions: Fall Restrictions Weight Bearing Restrictions: No      Mobility  Bed Mobility               General bed mobility comments: pt sitting up on EOB upon PT arrival  Transfers Overall transfer level: Needs assistance Equipment used: None Transfers: Sit to/from Stand Sit to Stand: Supervision         General transfer comment: pt with good technique  Ambulation/Gait Ambulation/Gait assistance: Min guard Ambulation Distance (Feet): 150 Feet Assistive device: None Gait Pattern/deviations: Step-through pattern Gait velocity: slow Gait velocity interpretation: Below normal speed for age/gender General Gait Details: mildly unsteady, furniture walking but was able to amb in hall without device  Stairs Stairs: Yes Stairs assistance: Min guard Stair Management: One rail Right Number of Stairs: 3 General stair comments: reciprocal  Wheelchair Mobility    Modified Rankin (Stroke Patients Only)       Balance Overall balance assessment: Modified Independent                                           Pertinent Vitals/Pain Pain  Assessment: No/denies pain    Home Living Family/patient expects to be discharged to:: Private residence Living Arrangements: Children (lives with daughter) Available Help at Discharge: Family Type of Home: House Home Access: Stairs to enter Entrance Stairs-Rails: Right Entrance Stairs-Number of Steps: 3 Home Layout: One level Home Equipment: Environmental consultant - 2 wheels;Shower seat      Prior Function Level of Independence: Independent         Comments: doesn't use AD, does ADLs indep     Hand Dominance   Dominant Hand: Right    Extremity/Trunk Assessment   Upper Extremity Assessment Upper Extremity Assessment: Generalized weakness    Lower Extremity Assessment Lower Extremity Assessment: Generalized weakness    Cervical / Trunk Assessment Cervical / Trunk Assessment: Normal  Communication   Communication: No difficulties  Cognition Arousal/Alertness: Awake/alert Behavior During Therapy: WFL for tasks assessed/performed Overall Cognitive Status: Within Functional Limits for tasks assessed                      General Comments      Exercises     Assessment/Plan    PT Assessment Patient needs continued PT services  PT Problem List Decreased strength;Decreased range of motion;Decreased activity tolerance;Decreased balance;Decreased mobility          PT Treatment Interventions      PT Goals (Current goals can be found in the Care Plan section)  Acute Rehab PT Goals Patient Stated  Goal: home PT Goal Formulation: With patient Time For Goal Achievement: 12/17/16 Potential to Achieve Goals: Good Additional Goals Additional Goal #1: pt to score >19 on DGI to indicate minimal falls risk.    Frequency Min 2X/week   Barriers to discharge        Co-evaluation               End of Session Equipment Utilized During Treatment: Gait belt Activity Tolerance: Patient tolerated treatment well Patient left: in chair;with call bell/phone within  reach;with family/visitor present Nurse Communication: Mobility status         Time: 1204-1225 PT Time Calculation (min) (ACUTE ONLY): 21 min   Charges:   PT Evaluation $PT Eval Low Complexity: 1 Procedure     PT G Codes:        Danaisha Celli M Cadarius Nevares 12/10/2016, 1:20 PM   Kittie Plater, PT, DPT Pager #: [phone] Office #: [phone]

## 2016-12-10 NOTE — Progress Notes (Signed)
Pt given discharge instructions, prescriptions, and care notes. Pt verbalized understanding AEB no further questions or concerns at this time. IV was discontinued, no redness, pain, or swelling noted at this time. Telemetry discontinued and Centralized Telemetry was notified. Pt left the floor via wheelchair with staff in stable condition. 

## 2016-12-13 LAB — CULTURE, BLOOD (ROUTINE X 2)
Culture: NO GROWTH
Culture: NO GROWTH

## 2016-12-17 DIAGNOSIS — I10 Essential (primary) hypertension: Secondary | ICD-10-CM | POA: Diagnosis not present

## 2016-12-17 DIAGNOSIS — Z09 Encounter for follow-up examination after completed treatment for conditions other than malignant neoplasm: Secondary | ICD-10-CM | POA: Diagnosis not present

## 2016-12-26 ENCOUNTER — Other Ambulatory Visit: Payer: Self-pay | Admitting: Internal Medicine

## 2016-12-26 DIAGNOSIS — N6320 Unspecified lump in the left breast, unspecified quadrant: Secondary | ICD-10-CM

## 2017-01-08 DIAGNOSIS — J111 Influenza due to unidentified influenza virus with other respiratory manifestations: Secondary | ICD-10-CM | POA: Diagnosis not present

## 2017-01-08 DIAGNOSIS — K5900 Constipation, unspecified: Secondary | ICD-10-CM | POA: Diagnosis not present

## 2017-01-08 DIAGNOSIS — M549 Dorsalgia, unspecified: Secondary | ICD-10-CM | POA: Diagnosis not present

## 2017-01-08 DIAGNOSIS — R079 Chest pain, unspecified: Secondary | ICD-10-CM | POA: Diagnosis not present

## 2017-01-09 ENCOUNTER — Other Ambulatory Visit: Payer: Self-pay | Admitting: Internal Medicine

## 2017-01-09 DIAGNOSIS — R9389 Abnormal findings on diagnostic imaging of other specified body structures: Secondary | ICD-10-CM

## 2017-01-16 ENCOUNTER — Ambulatory Visit
Admission: RE | Admit: 2017-01-16 | Discharge: 2017-01-16 | Disposition: A | Discharge status: Home / Self Care | Payer: Medicare Other | Source: Ambulatory Visit | Attending: Internal Medicine | Admitting: Internal Medicine

## 2017-01-16 DIAGNOSIS — R911 Solitary pulmonary nodule: Secondary | ICD-10-CM | POA: Diagnosis not present

## 2017-01-16 DIAGNOSIS — R9389 Abnormal findings on diagnostic imaging of other specified body structures: Secondary | ICD-10-CM

## 2017-01-30 DIAGNOSIS — I472 Ventricular tachycardia: Secondary | ICD-10-CM | POA: Diagnosis not present

## 2017-01-30 DIAGNOSIS — M546 Pain in thoracic spine: Secondary | ICD-10-CM | POA: Diagnosis not present

## 2017-01-30 DIAGNOSIS — R911 Solitary pulmonary nodule: Secondary | ICD-10-CM | POA: Diagnosis not present

## 2017-01-30 DIAGNOSIS — I2510 Atherosclerotic heart disease of native coronary artery without angina pectoris: Secondary | ICD-10-CM | POA: Diagnosis not present

## 2017-02-05 ENCOUNTER — Telehealth: Payer: Self-pay | Admitting: Cardiology

## 2017-02-05 NOTE — Telephone Encounter (Signed)
Received records from [facility] for appointment on 03/01/17 with Dr Ellyn Hack.  Records put with Dr Allison Quarry schedule for 03/01/17. lp

## 2017-02-15 ENCOUNTER — Ambulatory Visit
Admission: RE | Admit: 2017-02-15 | Discharge: 2017-02-15 | Disposition: A | Discharge status: Home / Self Care | Payer: Medicare Other | Source: Ambulatory Visit | Attending: Internal Medicine | Admitting: Internal Medicine

## 2017-02-15 DIAGNOSIS — R928 Other abnormal and inconclusive findings on diagnostic imaging of breast: Secondary | ICD-10-CM | POA: Diagnosis not present

## 2017-02-15 DIAGNOSIS — N6320 Unspecified lump in the left breast, unspecified quadrant: Secondary | ICD-10-CM

## 2017-03-01 ENCOUNTER — Encounter: Payer: Self-pay | Admitting: Cardiology

## 2017-03-01 ENCOUNTER — Ambulatory Visit (INDEPENDENT_AMBULATORY_CARE_PROVIDER_SITE_OTHER): Payer: Medicare Other | Admitting: Cardiology

## 2017-03-01 VITALS — BP 154/90 | HR 64 | Ht 59.0 in | Wt 136.0 lb

## 2017-03-01 DIAGNOSIS — R9431 Abnormal electrocardiogram [ECG] [EKG]: Secondary | ICD-10-CM

## 2017-03-01 DIAGNOSIS — I10 Essential (primary) hypertension: Secondary | ICD-10-CM | POA: Diagnosis not present

## 2017-03-01 DIAGNOSIS — I472 Ventricular tachycardia: Secondary | ICD-10-CM

## 2017-03-01 DIAGNOSIS — I2510 Atherosclerotic heart disease of native coronary artery without angina pectoris: Secondary | ICD-10-CM

## 2017-03-01 DIAGNOSIS — E785 Hyperlipidemia, unspecified: Secondary | ICD-10-CM

## 2017-03-01 DIAGNOSIS — I4729 Other ventricular tachycardia: Secondary | ICD-10-CM

## 2017-03-01 NOTE — Patient Instructions (Addendum)
Medication Dr Ellyn Hack agree with primary . Take carvedilol ( coreg) twice a day . Take one tablet at breakfast and one tablet dinner.   Only take the following supplements  -CALCIUM-MAGNESIUM- VITAMIN D -VITAMIN-D3 -CENTRUM SILVER VITAMIN -KRILL OIL   You do not need any other supplements   Your physician recommends that you schedule a follow-up appointment in as needed

## 2017-03-01 NOTE — Progress Notes (Signed)
PCP: Horatio Pel, MD  Clinic Note: Chief Complaint  Patient presents with  . 1 yr visit    Sent by hosp and PCP for evaluation of NSVT--started on carvedilol 3.125 mg bid; pt states she thinks she has been taking 1/2 tablet once or twice daily and it has been making stomach sick; c/o trouble with memory; no other Sx or problems    HPI: Toni Hayes is a 83 y.o. female with a PMH below who presents today for Delayed annual follow-up. She is a very pleasant elderly woman with a history of an abnormal EKG notable for left anterior fascicular block and incomplete right bundle branch block with questionable septal infarct.She is referred back to cardiology for concern of coronary atherosclerosis/calcification noted on CT scan and history of short episode of NSVT while in hospital. She previously been seen by Dr. Acie Fredrickson from our cardiology for chest discomfort and dyspnea. This was evaluated with a Myoview and echocardiogram that were essentially normal.  Toni Hayes was last seen in January 2017. At that time she was actually sure why she was referred to cardiology. She denied any routine exercise stating that she is just lazy. She was able to do whatever activities she wanted it without any issues.  Recent Hospitalizations: Admitted January 2018 for influenza-like illness. She presented with generalized weakness and poor appetite. She does be febrile to 102. She was consistent with sepsis. Was PCR positive for influenza a. Blood cultures were negative. She had an episode of asymptomatic nonsustained VT on January 14. No further episodes.  Studies Reviewed:   Transthoracic Echo January 2018: EF 65-70%. GR 1 DD. Mild RA dilation. Mild aortic regurgitation.  CT of Chest 01/16/2017: Atherosclerotic calcification of the aortic arch and descending aorta. Also noted left main, LAD, circumflex buttocks and RCA calcification.  Interval History: Since her discharge, Toni Hayes has been doing quite  well without any potential symptoms. She denies any sensation of rapid or irregular heartbeat/palpitations. She denies any PND, orthopnea or edema. No chest tightness pressure with rest or exertion. She is not overly active, but walks around does she would like to do. She has questions about several of her medications and discectomy by 1 daughters-in-law who is concerned about having medication she should take. She bounced back quite quickly from her flu illness.  No palpitations, lightheadedness, dizziness, weakness or syncope/near syncope. No TIA/amaurosis fugax symptoms. No melena, hematochezia, hematuria, or epstaxis. No claudication.  After long discussion with Toni Hayes, she is feeling fine, and is not overly interested in any aggressive cardiac evaluation in the absence of symptoms. She does not seem to be very extended in pursuing stress tests or invasive CV procedures like cardiac catheterizations.  ROS: A comprehensive was performed. Review of Systems  Constitutional: Negative for malaise/fatigue (Energy level getting better every day) and weight loss.       Regaining her energy and strength following her hospitalization.  HENT: Positive for hearing loss. Negative for congestion.   Respiratory: Negative for cough and shortness of breath.   Cardiovascular:       Per history of present illness  Gastrointestinal: Negative for blood in stool and melena.  Genitourinary: Negative for hematuria.  Musculoskeletal: Positive for joint pain (Normal arthritis pains). Negative for myalgias.  Neurological: Negative for dizziness.  Psychiatric/Behavioral: Negative for depression and memory loss. The patient is not nervous/anxious and does not have insomnia.   All other systems reviewed and are negative.   Past Medical History:  Diagnosis Date  .  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 B12 deficiency   . Back pain, thoracic    region  . Carpal tunnel syndrome, right   . Circulatory  disease    Personal Hx of unspecified  . Disorder of bone and cartilage   . Esophageal stricture   . Essential hypertension   . GERD (gastroesophageal reflux disease)   . Hemorrhage of rectum and anus   . Hx of colonic polyps   . Hyperlipidemia LDL goal <130   . Malignant neoplasm of uterus ([place])   . Sleep disorder   . Urinary frequency     Past Surgical History:  Procedure Laterality Date  . ANKLE FRACTURE SURGERY    . BREAST EXCISIONAL BIOPSY Left    benign  . BREAST LUMPECTOMY    . CHOLECYSTECTOMY    . COLONOSCOPY  08/20/12  . COLONOSCOPY W/ BIOPSIES AND POLYPECTOMY    . ESOPHAGEAL DILATION  2002  . ESOPHAGEAL DILATION  2006   Dr Sharlett Iles  . [facility]  May 2013   Normal stress nuclear study.  LV Ejection Fraction: 70%. LV Wall Motion: NL LV Function; NL Wall Motion  . TOTAL ABDOMINAL HYSTERECTOMY W/ BILATERAL SALPINGOOPHORECTOMY  2011   Dr Marlaine Hind; [facility]  . TRANSTHORACIC ECHOCARDIOGRAM  May 2013   Mild LVH, normal wall motion. EF ~55-60%. Mild AI & MR.  Mod LA dilation. -- Essentially normal Echo for age  . UPPER GASTROINTESTINAL ENDOSCOPY  08/20/12  . WRIST FRACTURE SURGERY     left arm    Current Meds  Medication Sig  . acetaminophen (TYLENOL) 500 MG tablet Take 500 mg by mouth every 6 (six) hours as needed for mild pain.  . Calcium Carbonate (CALCIUM 500 PO) Take 1,200 mg by mouth 2 (two) times daily.  . carvedilol (COREG) 3.125 MG tablet Take 1 tablet (3.125 mg total) by mouth 2 (two) times daily with a meal.  . Cholecalciferol (VITAMIN D3) 2000 units TABS Take 1 tablet by mouth daily.  Toni Hayes Calcium (STOOL SOFTENER PO) Take by mouth as needed.  Marland Kitchen estradiol (ESTRACE) 2 MG tablet Take 2 mg by mouth daily.  . Ferrous Sulfate (IRON) 325 (65 Fe) MG TABS Take 325 mg by mouth daily.  . folic acid (FOLVITE) 726 MCG tablet Take 400 mcg by mouth daily.  . hydrocortisone (ANUSOL-HC) 2.5 % rectal cream Place 1 application rectally 2 (two) times daily.    Toni Hayes Oil 500 MG CAPS Take 500 mg by mouth daily.  . Multiple Vitamin (MULTIVITAMIN) tablet Take 1 tablet by mouth daily.  . Probiotic Product (PROBIOTIC DAILY PO) Take by mouth daily.  Marland Kitchen VITAMIN E PO Take 1 capsule by mouth daily.    Allergies  Allergen Reactions  . Codeine     rash  . Meperidine Rash    Social History   Social History  . Marital status: Widowed    Spouse name: N/A  . Number of children: N/A  . Years of education: N/A   Social History Main Topics  . Smoking status: Former Smoker    Packs/day: 1.00    Years: 40.00    Quit date: 11/26/1972  . Smokeless tobacco: Never Used     Comment: quit about age 23  . Alcohol use 0.6 oz/week    1 Glasses of wine per week  . Drug use: No  . Sexual activity: Not Asked   Other Topics Concern  . None  Social History Narrative   Widowed mother of 3, grandmother of 17. 7+ great-grandchildren. Has been widowed for 10 years. Lives with her daughter.    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   Retired.  High School graduate.    family history includes Breast cancer in her mother; Colon cancer in her son; Diabetes in her brother; Heart attack in her brother; Liver cancer in her brother; Lung cancer in her brother; Stroke in her father.  Wt Readings from Last 3 Encounters:  03/01/17 136 lb (61.7 kg)  12/08/16 140 lb (63.5 kg)  12/01/15 148 lb 12.8 oz (67.5 kg)    PHYSICAL EXAM BP (!) 154/90 (BP Location: Left Arm, Patient Position: Sitting, Cuff Size: Normal)   Pulse 64   Ht 4\' 11"  (1.499 m)   Wt 136 lb (61.7 kg)   BMI 27.47 kg/m  General appearance: alert, cooperative, appears stated age, no distress and Otherwise healthy-appearing.  HEENT: [place]/AT, EOMI, MMM, anicteric sclera Neck: no adenopathy, no carotid bruit and no JVD Lungs: clear to auscultation bilaterally, normal percussion bilaterally and non-labored Heart: regular rate and rhythm, S1 and S2 normal, no murmur, click, rub or  gallop ; nondisplaced PMI Abdomen: soft, non-tender; bowel sounds normal; no masses,  no organomegaly; no HJR Extremities: extremities normal, atraumatic, no cyanosis, or edema Pulses: 2+ and symmetric;  Skin: no evidence of bleeding or bruising, no lesions noted and Normal turgor for age Neurologic: Mental status: Alert, oriented, thought content appropriate    Adult ECG Report  Rate: 64 ;  Rhythm: normal sinus rhythm and LAFB (-61). Incomplete RBBB. Cannot exclude septal MI, age undetermined.;   Narrative Interpretation: Stable EKG   Other studies Reviewed: Additional studies/ records that were reviewed today include:  Recent Labs:  November 2017  Total cholesterol 235, TG 141, HDL 60, LDL 145.    ASSESSMENT / PLAN: Problem List Items Addressed This Visit    Abnormal ECG (Chronic)    She has had the same findings on EKG since 2013. This is ordered and evaluated in the past with a Myoview which was negative for ischemia. Normal echocardiogram now. I would not further pursue evaluation.      Coronary artery calcification seen on CAT scan - Primary (Chronic)    Essentially asymptomatic findings. Not unexpectedly, she has both coronary and aortic calcification. While suggestive of underlying CAD, in the absence of symptoms I would not investigate with a stress test. We talked about this finding in conjunction with her lipids and based on the level of aggression that she would like to take in long-term maintenance, she would prefer and I agree to hold off on statin therapy unless she were to have symptoms. She would not be extended in noninvasive or invasive evaluations unless she is symptomatic. At that time, she would "have to make a decision "      Essential hypertension (Chronic)    Still has borderline blood pressure. As result, I would continue her current dose of carvedilol.  Given her age, would likely allow for some permissive hypertension. Especially in the absence of any  diastolic heart failure symptoms.      Relevant Orders   EKG 12-Lead   Hyperlipidemia LDL goal <130 (Chronic)    She had been on pravastatin in the past, but is no longer on pravastatin. At this point with her current lipids she is has not been adequately controlled, but I question the benefit from long-term treatment on statin.  We talked about pros and cons of taking a statin. Given the fact that statin treatment is more for long-term stabilization, she would prefer not to take medications unless necessary. I therefore think that it would not be unreasonable to simply continue taking Krill oil and hold off on statin in the absence of any active cardiovascular issues.      Nonsustained ventricular tachycardia (HCC)    Totally asymptomatic short run of NSVT while in the hospital sick with influenza. Essentially normal echocardiogram with no signs of any prior infarct. Simply because she has hypertension and has had the nonsustained VT, I do agree that beta blocker may be a wise choice. However her resting heart rate in the 60s would preclude [place] from being able to titrated further.         Although no major medication adjustments made, we did spend 30-35 minutes in direct counseling and discussing the relative pros and cons of different medical management. We discussed the other medicines that she is on/supplements that she takes. Greater than 75% of the time was in direct counseling.  Current medicines are reviewed at length with the patient today. (+/- concerns) Does she need to take statin or not. She still needs to take beta blocker. Will of the medication should she take The following changes have been made: --> See below  Patient Instructions  Medication Dr Ellyn Hack agree with primary . Take carvedilol ( coreg) twice a day . Take one tablet at breakfast and one tablet dinner.   Only take the following supplements  -CALCIUM-MAGNESIUM- VITAMIN D -VITAMIN-D3 -CENTRUM SILVER  VITAMIN -KRILL OIL   You do not need any other supplements   Your physician recommends that you schedule a follow-up appointment in as needed -- symptoms to monitor for are significant palpitations or chest tightness/pressure with rest or exertion. Also concern for shortness of breath on exertion or lying down. Worsening edema.  Studies Ordered:   Orders Placed This Encounter  Procedures  . EKG 12-Lead      Glenetta Hew, M.D., M.S. Interventional Cardiologist   Pager # [phone] Phone # [phone] [address] [place], [place] [address]

## 2017-03-03 DIAGNOSIS — I2510 Atherosclerotic heart disease of native coronary artery without angina pectoris: Secondary | ICD-10-CM | POA: Insufficient documentation

## 2017-03-03 DIAGNOSIS — I4729 Other ventricular tachycardia: Secondary | ICD-10-CM | POA: Insufficient documentation

## 2017-03-03 DIAGNOSIS — I472 Ventricular tachycardia: Secondary | ICD-10-CM | POA: Insufficient documentation

## 2017-03-03 NOTE — Assessment & Plan Note (Signed)
She had been on pravastatin in the past, but is no longer on pravastatin. At this point with her current lipids she is has not been adequately controlled, but I question the benefit from long-term treatment on statin. We talked about pros and cons of taking a statin. Given the fact that statin treatment is more for long-term stabilization, she would prefer not to take medications unless necessary. I therefore think that it would not be unreasonable to simply continue taking Krill oil and hold off on statin in the absence of any active cardiovascular issues.

## 2017-03-03 NOTE — Assessment & Plan Note (Signed)
She has had the same findings on EKG since 2013. This is ordered and evaluated in the past with a Myoview which was negative for ischemia. Normal echocardiogram now. I would not further pursue evaluation.

## 2017-03-03 NOTE — Assessment & Plan Note (Signed)
Totally asymptomatic short run of NSVT while in the hospital sick with influenza. Essentially normal echocardiogram with no signs of any prior infarct. Simply because she has hypertension and has had the nonsustained VT, I do agree that beta blocker may be a wise choice. However her resting heart rate in the 60s would preclude [place] from being able to titrated further.

## 2017-03-03 NOTE — Assessment & Plan Note (Signed)
Still has borderline blood pressure. As result, I would continue her current dose of carvedilol.  Given her age, would likely allow for some permissive hypertension. Especially in the absence of any diastolic heart failure symptoms.

## 2017-03-03 NOTE — Assessment & Plan Note (Signed)
Essentially asymptomatic findings. Not unexpectedly, she has both coronary and aortic calcification. While suggestive of underlying CAD, in the absence of symptoms I would not investigate with a stress test. We talked about this finding in conjunction with her lipids and based on the level of aggression that she would like to take in long-term maintenance, she would prefer and I agree to hold off on statin therapy unless she were to have symptoms. She would not be extended in noninvasive or invasive evaluations unless she is symptomatic. At that time, she would "have to make a decision "

## 2017-03-06 ENCOUNTER — Other Ambulatory Visit: Payer: Self-pay | Admitting: *Deleted

## 2017-03-06 ENCOUNTER — Telehealth: Payer: Self-pay | Admitting: Cardiology

## 2017-03-06 NOTE — Telephone Encounter (Signed)
New message     *STAT* If patient is at the pharmacy, call can be transferred to refill team.   1. Which medications need to be refilled? (please list name of each medication and dose if known) carvedilol (COREG) 3.125 MG tablet  2. Which pharmacy/location (including street and city if local pharmacy) is medication to be sent to? [address]  3. Do they need a 30 day or 90 day supply? 90 day supply

## 2017-03-07 MED ORDER — CARVEDILOL 3.125 MG PO TABS: 3.1250 mg | ORAL_TABLET | Freq: Two times a day (BID) | ORAL | 6 refills | Status: DC

## 2017-03-07 NOTE — Telephone Encounter (Signed)
Refilled for 6 months, primary will need to refill after that , - as needed basis follow up.

## 2017-07-25 DIAGNOSIS — H6123 Impacted cerumen, bilateral: Secondary | ICD-10-CM | POA: Diagnosis not present

## 2017-07-25 DIAGNOSIS — I10 Essential (primary) hypertension: Secondary | ICD-10-CM | POA: Diagnosis not present

## 2017-07-25 DIAGNOSIS — H9193 Unspecified hearing loss, bilateral: Secondary | ICD-10-CM | POA: Diagnosis not present

## 2017-09-02 DIAGNOSIS — Z23 Encounter for immunization: Secondary | ICD-10-CM | POA: Diagnosis not present

## 2017-10-09 DIAGNOSIS — M810 Age-related osteoporosis without current pathological fracture: Secondary | ICD-10-CM | POA: Diagnosis not present

## 2017-10-09 DIAGNOSIS — Z0000 Encounter for general adult medical examination without abnormal findings: Secondary | ICD-10-CM | POA: Diagnosis not present

## 2017-10-09 DIAGNOSIS — N390 Urinary tract infection, site not specified: Secondary | ICD-10-CM | POA: Diagnosis not present

## 2017-10-09 DIAGNOSIS — I10 Essential (primary) hypertension: Secondary | ICD-10-CM | POA: Diagnosis not present

## 2017-10-09 DIAGNOSIS — E785 Hyperlipidemia, unspecified: Secondary | ICD-10-CM | POA: Diagnosis not present

## 2017-10-14 DIAGNOSIS — R079 Chest pain, unspecified: Secondary | ICD-10-CM | POA: Diagnosis not present

## 2017-10-14 DIAGNOSIS — M810 Age-related osteoporosis without current pathological fracture: Secondary | ICD-10-CM | POA: Diagnosis not present

## 2017-10-14 DIAGNOSIS — H9193 Unspecified hearing loss, bilateral: Secondary | ICD-10-CM | POA: Diagnosis not present

## 2017-10-14 DIAGNOSIS — I444 Left anterior fascicular block: Secondary | ICD-10-CM | POA: Diagnosis not present

## 2017-10-14 DIAGNOSIS — I472 Ventricular tachycardia: Secondary | ICD-10-CM | POA: Diagnosis not present

## 2017-10-14 DIAGNOSIS — Z803 Family history of malignant neoplasm of breast: Secondary | ICD-10-CM | POA: Diagnosis not present

## 2017-10-14 DIAGNOSIS — M546 Pain in thoracic spine: Secondary | ICD-10-CM | POA: Diagnosis not present

## 2017-10-14 DIAGNOSIS — M79644 Pain in right finger(s): Secondary | ICD-10-CM | POA: Diagnosis not present

## 2017-10-14 DIAGNOSIS — E785 Hyperlipidemia, unspecified: Secondary | ICD-10-CM | POA: Diagnosis not present

## 2017-10-14 DIAGNOSIS — I10 Essential (primary) hypertension: Secondary | ICD-10-CM | POA: Diagnosis not present

## 2017-10-14 DIAGNOSIS — I2510 Atherosclerotic heart disease of native coronary artery without angina pectoris: Secondary | ICD-10-CM | POA: Diagnosis not present

## 2017-10-14 DIAGNOSIS — R911 Solitary pulmonary nodule: Secondary | ICD-10-CM | POA: Diagnosis not present

## 2017-10-14 DIAGNOSIS — Z6829 Body mass index (BMI) 29.0-29.9, adult: Secondary | ICD-10-CM | POA: Diagnosis not present

## 2017-11-14 IMAGING — DX DG CHEST 2V
2 series · 2 of 2 positions shown · non-contrast
Comparison: December 08, 2016

CLINICAL DATA: Productive cough

EXAM:
CHEST  2 VIEW

[chest pa]
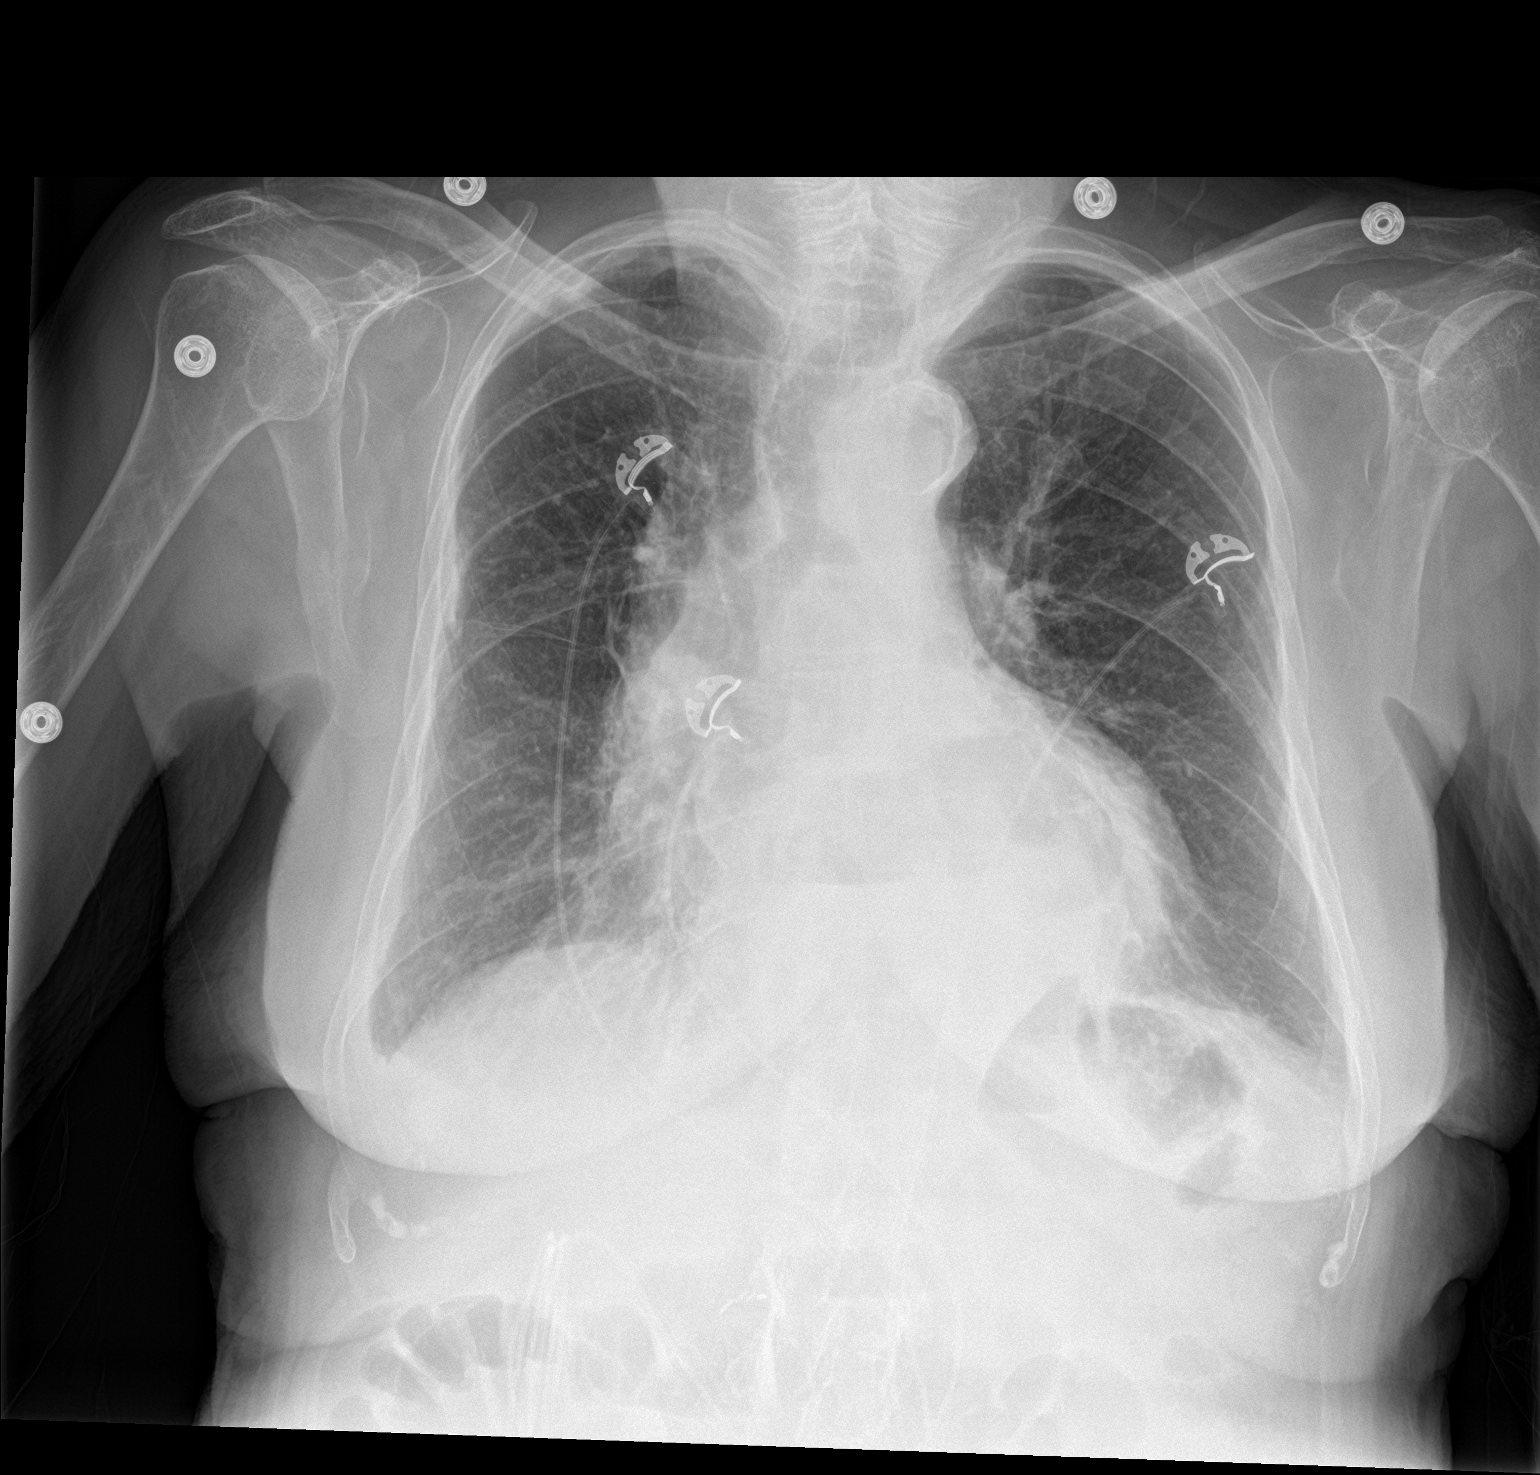

[chest lat]
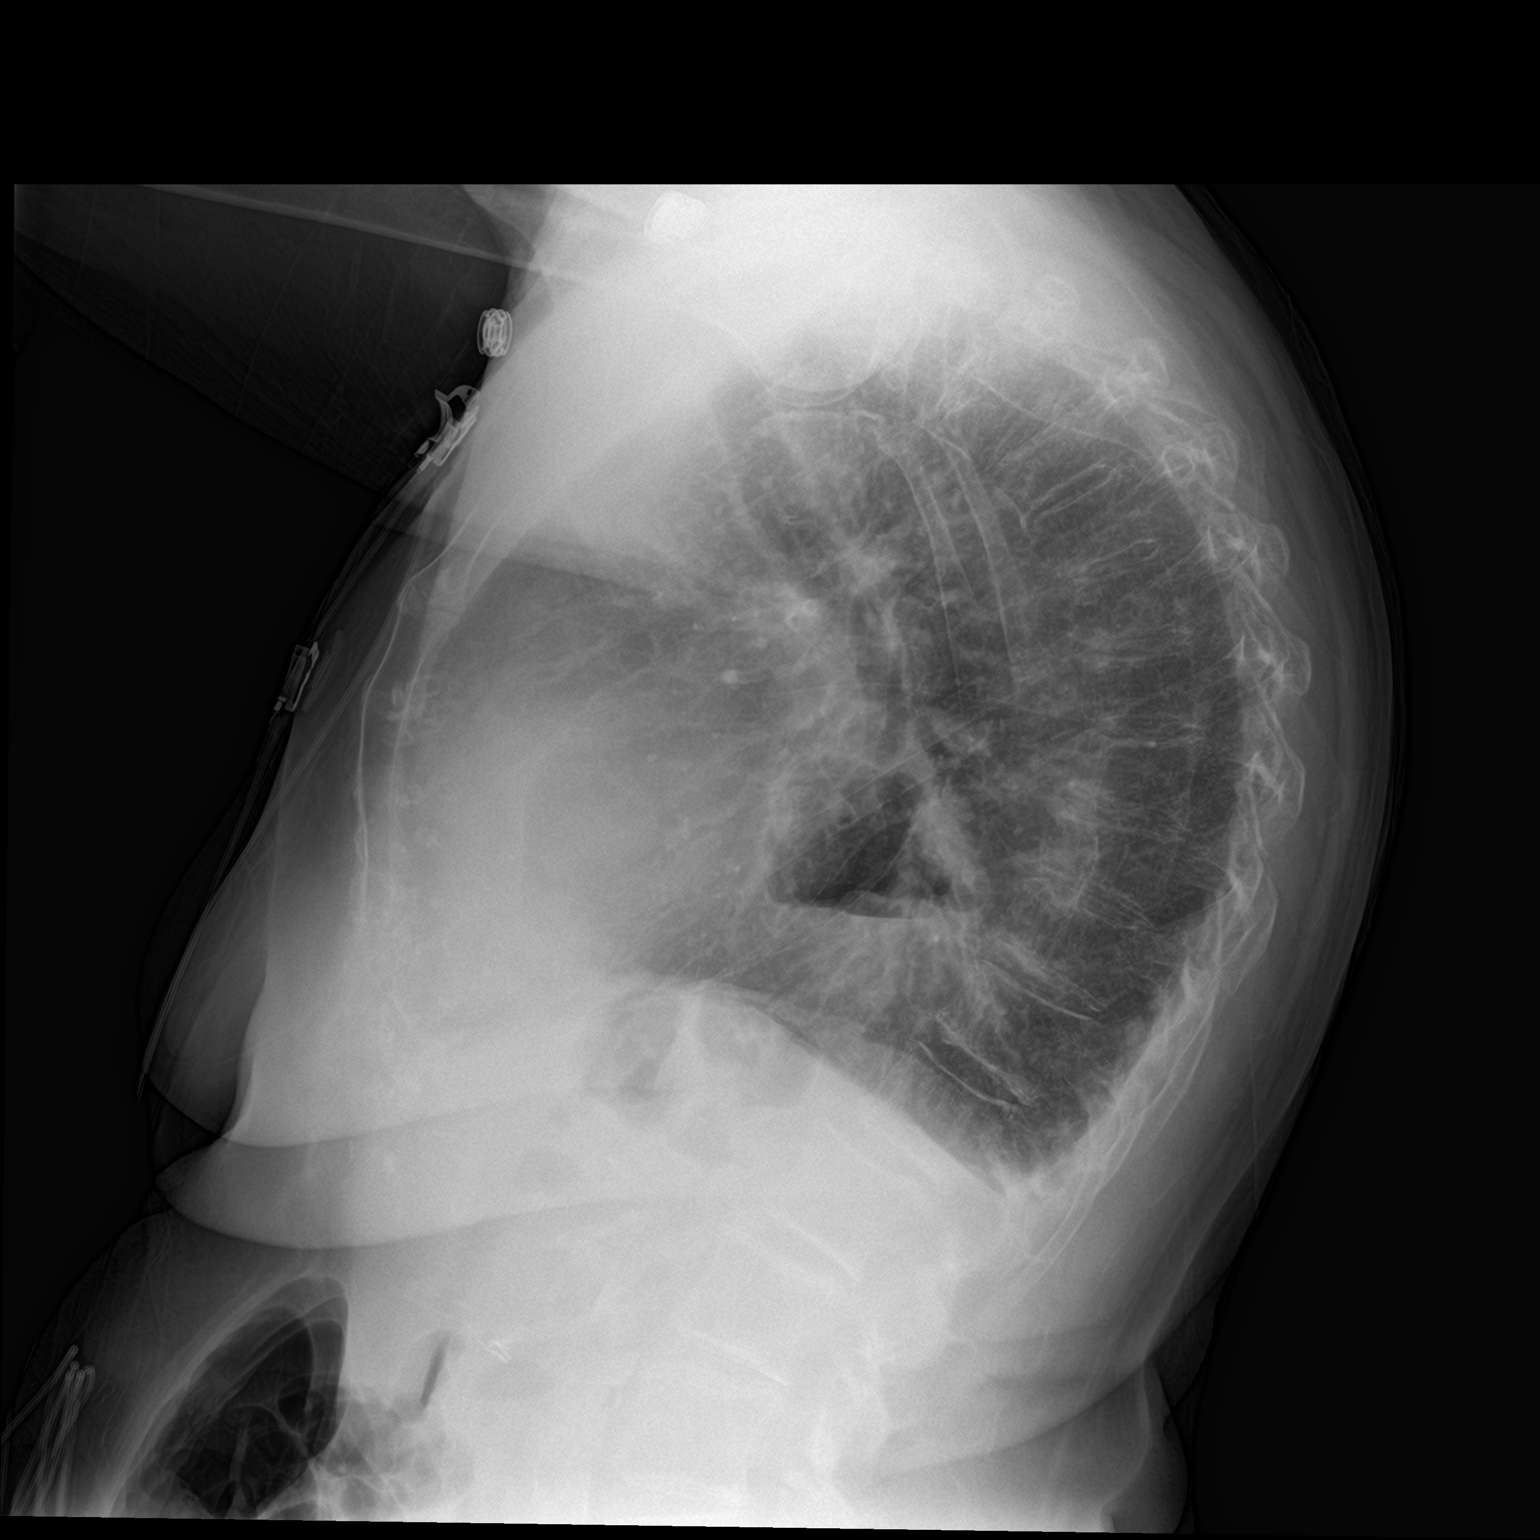

[2 of 2 positions shown; findings below may reference images not displayed]

FINDINGS: Stable cardiomediastinal silhouette. Mild bibasilar atelectasis and
probable tiny right effusion. Large hiatal hernia. No other interval
changes or acute abnormalities.
IMPRESSION: No interval change. Mild bibasilar atelectasis and tiny right
effusion.

## 2017-11-28 DIAGNOSIS — I10 Essential (primary) hypertension: Secondary | ICD-10-CM | POA: Diagnosis not present

## 2017-11-28 DIAGNOSIS — M810 Age-related osteoporosis without current pathological fracture: Secondary | ICD-10-CM | POA: Diagnosis not present

## 2018-01-27 ENCOUNTER — Other Ambulatory Visit: Payer: Self-pay | Admitting: Internal Medicine

## 2018-01-27 DIAGNOSIS — Z1231 Encounter for screening mammogram for malignant neoplasm of breast: Secondary | ICD-10-CM

## 2018-02-18 ENCOUNTER — Ambulatory Visit
Admission: RE | Admit: 2018-02-18 | Discharge: 2018-02-18 | Disposition: A | Discharge status: Home / Self Care | Payer: Medicare Other | Source: Ambulatory Visit | Attending: Internal Medicine | Admitting: Internal Medicine

## 2018-02-18 DIAGNOSIS — Z1231 Encounter for screening mammogram for malignant neoplasm of breast: Secondary | ICD-10-CM | POA: Diagnosis not present

## 2018-04-26 ENCOUNTER — Other Ambulatory Visit: Payer: Self-pay | Admitting: Cardiology

## 2018-04-29 ENCOUNTER — Telehealth: Payer: Self-pay | Admitting: Cardiology

## 2018-04-29 NOTE — Telephone Encounter (Signed)
Called and LVM for the patient to call back to schedule yearly visit with Dr. Ellyn Hack.

## 2018-04-29 NOTE — Telephone Encounter (Signed)
Rx(s) sent to pharmacy electronically.  

## 2018-04-29 NOTE — Telephone Encounter (Signed)
New message     *STAT* If patient is at the pharmacy, call can be transferred to refill team.   1. Which medications need to be refilled? (please list name of each medication and dose if known)  carvedilol (COREG) 3.125 MG tablet Take 1 tablet (3.125 mg total) by mouth 2 (two) times daily with a meal.        2. Which pharmacy/location (including street and city if local pharmacy) is medication to be sent to? [facility] [address]   3. Do they need a 30 day or 90 day supply? [place]

## 2018-04-29 NOTE — Telephone Encounter (Signed)
Patient notified directly and was on her way to pick up her rx.

## 2018-05-07 DIAGNOSIS — R69 Illness, unspecified: Secondary | ICD-10-CM | POA: Diagnosis not present

## 2018-05-20 ENCOUNTER — Encounter: Payer: Self-pay | Admitting: Cardiology

## 2018-05-20 ENCOUNTER — Ambulatory Visit: Payer: Medicare HMO | Admitting: Cardiology

## 2018-05-20 DIAGNOSIS — I4729 Other ventricular tachycardia: Secondary | ICD-10-CM

## 2018-05-20 DIAGNOSIS — E785 Hyperlipidemia, unspecified: Secondary | ICD-10-CM

## 2018-05-20 DIAGNOSIS — I2510 Atherosclerotic heart disease of native coronary artery without angina pectoris: Secondary | ICD-10-CM | POA: Diagnosis not present

## 2018-05-20 DIAGNOSIS — I472 Ventricular tachycardia: Secondary | ICD-10-CM

## 2018-05-20 DIAGNOSIS — I10 Essential (primary) hypertension: Secondary | ICD-10-CM | POA: Diagnosis not present

## 2018-05-20 MED ORDER — METOPROLOL SUCCINATE ER 25 MG PO TB24: 25.0000 mg | ORAL_TABLET | Freq: Every day | ORAL | 3 refills | Status: DC

## 2018-05-20 NOTE — Assessment & Plan Note (Signed)
She had one totally asymptomatic short run of nonsustained VT while in the hospital normal echo / acutely ill with sepsis.   No evidence of ischemic symptoms per normal echo therefore we decided not to perform ischemic evaluation. Beta-blocker

## 2018-05-20 NOTE — Patient Instructions (Addendum)
Medication Instructions:  Your physician has recommended you make the following change in your medication:   COMPLETE CARVEDILOL THEN STOP.  START TOPROL XL 25 MG  ( METOPROLOL SUCCINATE) ONE TABLET DAILY.  Follow-Up: Your physician wants you to follow-up in 1 year with Dr. Ellyn Hack. You will receive a reminder letter in the mail two months in advance. If you don't receive a letter, please call our office to schedule the follow-up appointment.   If you need a refill on your cardiac medications before your next appointment, please call your pharmacy.

## 2018-05-20 NOTE — Assessment & Plan Note (Signed)
Asymptomatic without any active heart failure symptoms.  As previously discussed, no plans for ischemic evaluation unless symptoms warrant.  Continue to treat cardiovascular risk with beta-blocker and dietary adjustments.  Not interested in taking statin

## 2018-05-20 NOTE — Progress Notes (Signed)
PCP: Deland Pretty, MD  Clinic Note: Chief Complaint  Patient presents with  . Follow-up    No complaints  . Coronary Artery Disease    Coronary calcification CT    HPI: Toni Hayes is a 84 y.o. female with a PMH below who presents today for ~annual f/u.  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  She has CT Scan docomented  coronary atherosclerosis/calcification and history of short episode of NSVT while hospitalized for post-influenza PNA-Sepsis in Jan 2018.  She previously been seen by Dr. Acie Fredrickson from our cardiology for chest discomfort and dyspnea. This was evaluated with a Myoview and echocardiogram that were essentially normal.  Toni Hayes was last seen in April 2018. Was doing well without any notable SSx of palpitations, CP, dyspnea etc.   We decided that her VT run with normal Echo in the setting of acute illness along with Coronary Calcification without Coronary CTA or stress test in the absence of SSx of ischemia.   Recent Hospitalizations:   None  Studies Reviewed:   None  Interval History: Toni Hayes returns today mildly elevated she is healthy with no major complaints.  She denies any sign of fatigue, palpitations or chest tightness/pressure.  No resting or exertional dyspnea.  She does note that she is not all that active, but is able to do her activities of daily living without issues.  She is not doing any routine exercise, but is not sedentary.  She enjoys mowing her lawn with a riding mower, and likes to do her house chores but not seems that she used to several years ago. She has not had any syncope or near syncope symptoms.  No TIA or amaurosis fugax symptoms. No PND, orthopnea or edema.  No claudication. She continues to Mind that she would not want to have procedures done especially in the setting of no symptoms.   ROS: A comprehensive was performed. Review of Systems    Constitutional: Negative for malaise/fatigue and weight loss.  HENT: Positive for hearing loss. Negative for congestion.   Respiratory: Negative for cough and shortness of breath.   Cardiovascular:       Per history of present illness  Gastrointestinal: Negative for blood in stool and melena.  Genitourinary: Negative for hematuria.  Musculoskeletal: Positive for joint pain (Normal arthritis pains). Negative for myalgias.  Neurological: Negative for dizziness.  Psychiatric/Behavioral: Negative for depression and memory loss. The patient is not nervous/anxious and does not have insomnia.   All other systems reviewed and are negative.   Past Medical History:  Diagnosis Date  . 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 B12 deficiency   . Back pain, thoracic    region  . Carpal tunnel syndrome, right   . Circulatory disease    Personal Hx of unspecified  . Coronary artery calcification seen on CAT scan   . Disorder of bone and cartilage   . Esophageal stricture   . Essential hypertension   . GERD (gastroesophageal reflux disease)   . Hemorrhage of rectum and anus   . Hx of colonic polyps   . Hyperlipidemia LDL goal <130   . Malignant neoplasm of uterus ([place])   . Sleep disorder   . Urinary frequency     Past Surgical History:  Procedure Laterality Date  . ANKLE FRACTURE SURGERY    . BREAST EXCISIONAL  BIOPSY Left    benign  . CHOLECYSTECTOMY    . COLONOSCOPY  08/20/12  . COLONOSCOPY W/ BIOPSIES AND POLYPECTOMY    . ESOPHAGEAL DILATION  2002  . ESOPHAGEAL DILATION  2006   Dr Sharlett Iles  . [facility]  May 2013   Normal stress nuclear study.  LV Ejection Fraction: 70%. LV Wall Motion: NL LV Function; NL Wall Motion  . TOTAL ABDOMINAL HYSTERECTOMY W/ BILATERAL SALPINGOOPHORECTOMY  2011   Dr Marlaine Hind; [facility]  . TRANSTHORACIC ECHOCARDIOGRAM  11/2016   EF 65-70%. GR 1 DD. Mild RA dilation. Mild aortic regurgitation.  (In the setting of sepsis)  . UPPER  GASTROINTESTINAL ENDOSCOPY  08/20/12  . WRIST FRACTURE SURGERY     left arm    Current Meds  Medication Sig  . acetaminophen (TYLENOL) 500 MG tablet Take 500 mg by mouth every 6 (six) hours as needed for mild pain.  . Calcium Carbonate (CALCIUM 500 PO) Take 1,200 mg by mouth 2 (two) times daily.  . Cholecalciferol (VITAMIN D3) 2000 units TABS Take 1 tablet by mouth daily.  [name] Calcium (STOOL SOFTENER PO) Take by mouth as needed.  Marland Kitchen estradiol (ESTRACE) 2 MG tablet Take 2 mg by mouth as needed.   . hydrocortisone (ANUSOL-HC) 2.5 % rectal cream Place 1 application rectally 2 (two) times daily.  [name] Oil 500 MG CAPS Take 500 mg by mouth daily.  . Multiple Vitamin (MULTIVITAMIN) tablet Take 1 tablet by mouth daily.  . Probiotic Product (PROBIOTIC DAILY PO) Take by mouth daily.  . risedronate (ACTONEL) 35 MG tablet Take 35 mg by mouth once a week.  Marland Kitchen VITAMIN E PO Take 1 capsule by mouth daily.  . [DISCONTINUED] carvedilol (COREG) 3.125 MG tablet Take 1 tablet (3.125 mg total) by mouth 2 (two) times daily with a meal. PLEASE CONTACT OFFICE FOR ADDITIONAL REFILLS  . [DISCONTINUED] Ferrous Sulfate (IRON) 325 (65 Fe) MG TABS Take 325 mg by mouth daily.  . [DISCONTINUED] folic acid (FOLVITE) 132 MCG tablet Take 400 mcg by mouth daily.  --She is actually only taking her metoprolol once a day.  Allergies  Allergen Reactions  . Codeine     rash  . Meperidine Rash    Social History   Tobacco Use  . Smoking status: Former Smoker    Packs/day: 1.00    Years: 40.00    Pack years: 40.00    Last attempt to quit: 11/26/1972    Years since quitting: 45.5  . Smokeless tobacco: Never Used  . Tobacco comment: quit about age 21  Substance Use Topics  . Alcohol use: Yes    Alcohol/week: 0.6 oz    Types: 1 Glasses of wine per week  . Drug use: No    Social History   Social History Narrative   Widowed mother of 3, grandmother of 53. 7+ great-grandchildren. Has been widowed for 10 years.  Lives with her daughter.    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   Retired.  High School graduate.   Family History family history includes Breast cancer in her mother; Colon cancer in her son; Diabetes in her brother; Heart attack in her brother; Liver cancer in her brother; Lung cancer in her brother; Stroke in her father.  Wt Readings from Last 3 Encounters:  05/20/18 143 lb 3.2 oz (65 kg)  03/01/17 136 lb (61.7 kg)  12/08/16 140 lb (63.5 kg)    PHYSICAL EXAM BP 130/84  Pulse 62   Ht 4\' 11"  (1.499 m)   Wt 143 lb 3.2 oz (65 kg)   BMI 28.92 kg/m   Physical Exam  Constitutional: She is oriented to person, place, and time. She appears well-developed and well-nourished. No distress.  Healthy-appearing.  Well-groomed.  Appears to be in the stated age.  HENT:  Head: Normocephalic and atraumatic.  Neck: Normal range of motion. Neck supple. No hepatojugular reflux and no JVD present. Carotid bruit is not present.  Cardiovascular: Normal rate, regular rhythm, normal heart sounds and intact distal pulses.  No extrasystoles are present. PMI is not displaced. Exam reveals no gallop and no friction rub.  No murmur heard. Pulmonary/Chest: Effort normal and breath sounds normal. No respiratory distress. She has no wheezes. She has no rales.  Abdominal: Soft. Bowel sounds are normal. She exhibits no distension. There is no tenderness. There is no rebound.  Musculoskeletal: Normal range of motion. She exhibits no edema.  Lymphadenopathy:    She has no cervical adenopathy.  Neurological: She is alert and oriented to person, place, and time.  Psychiatric: She has a normal mood and affect. Her behavior is normal. Judgment and thought content normal.    Adult ECG Report n/a   Other studies Reviewed: Additional studies/ records that were reviewed today include:  Recent Labs:  November 2018  Total cholesterol 211, TG 12 1, HDL 60, LDL 123 .    ASSESSMENT /  PLAN: Problem List Items Addressed This Visit    Nonsustained ventricular tachycardia ([place])    She had one totally asymptomatic short run of nonsustained VT while in the hospital normal echo / acutely ill with sepsis.   No evidence of ischemic symptoms per normal echo therefore we decided not to perform ischemic evaluation. Beta-blocker      Relevant Medications   metoprolol succinate (TOPROL XL) 25 MG 24 hr tablet   Hyperlipidemia LDL goal <130 (Chronic)    Based on long discussion last year, we decided to hold on: Restarting statin.  Despite not being on statin, she has had improvement in her lipids.      Relevant Medications   metoprolol succinate (TOPROL XL) 25 MG 24 hr tablet   Essential hypertension (Chronic)    Her blood pressure looks great today and she has not taken any medicines yet.  She is only taking her  carvedilol once a day, I will therefore switch her to Toprol 25 mg once daily.      Relevant Medications   metoprolol succinate (TOPROL XL) 25 MG 24 hr tablet   Coronary artery calcification seen on CAT scan (Chronic)    Asymptomatic without any active heart failure symptoms.  As previously discussed, no plans for ischemic evaluation unless symptoms warrant.  Continue to treat cardiovascular risk with beta-blocker and dietary adjustments.  Not interested in taking statin      Relevant Medications   metoprolol succinate (TOPROL XL) 25 MG 24 hr tablet     Current medicines are reviewed at length with the patient today. (+/- concerns) n/a The following changes have been made: --> See below  Patient Instructions  Medication Instructions:  Your physician has recommended you make the following change in your medication:   COMPLETE CARVEDILOL THEN STOP.  START TOPROL XL 25 MG  ( METOPROLOL SUCCINATE) ONE TABLET DAILY.  Follow-Up: Your physician wants you to follow-up in 1 year with Dr. Ellyn Hack. You will receive a reminder letter in the mail two months in advance.  If you  don't receive a letter, please call our office to schedule the follow-up appointment.   If you need a refill on your cardiac medications before your next appointment, please call your pharmacy.  -- symptoms to monitor for are significant palpitations or chest tightness/pressure with rest or exertion. Also concern for shortness of breath on exertion or lying down. Worsening edema.  Studies Ordered:   No orders of the defined types were placed in this encounter.     Glenetta Hew, M.D., M.S. Interventional Cardiologist   Pager # [phone] Phone # [phone] [address]. [place], [place] [address]

## 2018-05-20 NOTE — Assessment & Plan Note (Signed)
Based on long discussion last year, we decided to hold on: Restarting statin.  Despite not being on statin, she has had improvement in her lipids.

## 2018-05-20 NOTE — Assessment & Plan Note (Signed)
Her blood pressure looks great today and she has not taken any medicines yet.  She is only taking her  carvedilol once a day, I will therefore switch her to Toprol 25 mg once daily.

## 2018-05-21 ENCOUNTER — Other Ambulatory Visit: Payer: Self-pay | Admitting: Cardiology

## 2018-08-19 DIAGNOSIS — R69 Illness, unspecified: Secondary | ICD-10-CM | POA: Diagnosis not present

## 2018-10-21 DIAGNOSIS — I10 Essential (primary) hypertension: Secondary | ICD-10-CM | POA: Diagnosis not present

## 2018-10-21 DIAGNOSIS — M810 Age-related osteoporosis without current pathological fracture: Secondary | ICD-10-CM | POA: Diagnosis not present

## 2018-10-21 DIAGNOSIS — E785 Hyperlipidemia, unspecified: Secondary | ICD-10-CM | POA: Diagnosis not present

## 2018-10-27 DIAGNOSIS — E785 Hyperlipidemia, unspecified: Secondary | ICD-10-CM | POA: Diagnosis not present

## 2018-10-27 DIAGNOSIS — I472 Ventricular tachycardia: Secondary | ICD-10-CM | POA: Diagnosis not present

## 2018-10-27 DIAGNOSIS — M810 Age-related osteoporosis without current pathological fracture: Secondary | ICD-10-CM | POA: Diagnosis not present

## 2018-10-27 DIAGNOSIS — R413 Other amnesia: Secondary | ICD-10-CM | POA: Diagnosis not present

## 2018-10-27 DIAGNOSIS — I444 Left anterior fascicular block: Secondary | ICD-10-CM | POA: Diagnosis not present

## 2018-10-27 DIAGNOSIS — Z6829 Body mass index (BMI) 29.0-29.9, adult: Secondary | ICD-10-CM | POA: Diagnosis not present

## 2018-10-27 DIAGNOSIS — R911 Solitary pulmonary nodule: Secondary | ICD-10-CM | POA: Diagnosis not present

## 2018-10-27 DIAGNOSIS — M546 Pain in thoracic spine: Secondary | ICD-10-CM | POA: Diagnosis not present

## 2018-10-27 DIAGNOSIS — I2510 Atherosclerotic heart disease of native coronary artery without angina pectoris: Secondary | ICD-10-CM | POA: Diagnosis not present

## 2018-10-27 DIAGNOSIS — I10 Essential (primary) hypertension: Secondary | ICD-10-CM | POA: Diagnosis not present

## 2018-10-27 DIAGNOSIS — Z0000 Encounter for general adult medical examination without abnormal findings: Secondary | ICD-10-CM | POA: Diagnosis not present

## 2018-10-30 ENCOUNTER — Encounter: Payer: Self-pay | Admitting: Neurology

## 2019-01-13 ENCOUNTER — Other Ambulatory Visit: Payer: Self-pay | Admitting: Internal Medicine

## 2019-01-13 DIAGNOSIS — Z1231 Encounter for screening mammogram for malignant neoplasm of breast: Secondary | ICD-10-CM

## 2019-01-16 ENCOUNTER — Ambulatory Visit: Payer: Medicare HMO | Admitting: Neurology

## 2019-02-24 ENCOUNTER — Ambulatory Visit: Payer: Medicare HMO

## 2019-03-24 ENCOUNTER — Ambulatory Visit: Payer: Medicare HMO

## 2019-04-15 DIAGNOSIS — R69 Illness, unspecified: Secondary | ICD-10-CM | POA: Diagnosis not present

## 2019-05-01 ENCOUNTER — Ambulatory Visit: Payer: Medicare HMO

## 2019-05-05 ENCOUNTER — Ambulatory Visit: Payer: Medicare HMO

## 2019-05-26 ENCOUNTER — Telehealth: Payer: Self-pay | Admitting: Adult Health Nurse Practitioner

## 2019-05-26 ENCOUNTER — Telehealth: Payer: Self-pay

## 2019-05-26 NOTE — Telephone Encounter (Addendum)
SW contacted Toni Hayes (patient's daughter-in-law). Toni Hayes and Toni Hayes's daughter, Toni Hayes are [employer]. Toni Hayes provided summary of patient's medical and care history. Toni Hayes advised that Toni Pel, NP is now PCP and they are glad that she has now established with her. Toni Hayes said that care in the home currently seems to be going well. Toni Hayes indicates that her main concern at this time is patient's confusion and sadness. Toni Hayes said patient has mentioned feeling down or sad, and wanting to feel better. Toni Hayes was asking about potential medication options, SW to notify palliative care NP. SW requested copies of HCPOA form. Toni Hayes requested that DNR form be mailed. SW to mail form.

## 2019-05-26 NOTE — Telephone Encounter (Signed)
Talked with patient's daughter-in-law Ayeisha Lindenberger [facility]) and have scheduled a [place] for 06/04/19 @ 2 PM.  Verbal consent rec'd from [facility] for Palliative services.

## 2019-05-26 NOTE — Telephone Encounter (Addendum)
SW contacted the home and spoke with [name] (patient's daughter). [name] provided summary of patient's history. Patient and [name] live in the home. [name] reports that she is helping with care in the home. [name] notes patient is having increased confusion, especially at night. [name] provided specific examples where patient will want to go to somewhere to see her husband, and husband passed years ago. [name] said patient is mostly independent but [name] does do the cooking and cleaning. Patient is ambulating well and without assistance. [name] notes that patient's daughter-in-law and granddaughter help with care but live about an hour away. [name] denies any current care concerns and feels that they are managing "fine". [name] acknowledged that this could change as patient declines but does not feel she needs any resource information at this time. [name] agreed for SW to contact [name] (patient's daughter-in-law). SW to follow-up with [name] and continue discussion of care and ACP.

## 2019-06-04 ENCOUNTER — Other Ambulatory Visit: Payer: Self-pay

## 2019-06-04 ENCOUNTER — Other Ambulatory Visit: Payer: Medicare HMO | Admitting: Adult Health Nurse Practitioner

## 2019-06-04 DIAGNOSIS — Z515 Encounter for palliative care: Secondary | ICD-10-CM | POA: Diagnosis not present

## 2019-06-04 NOTE — Progress Notes (Signed)
[facility] Consult Note Telephone: [phone]  Fax: [phone]  PATIENT NAME: Toni Hayes DOB: 06-17-1924 MRN: [id number]  PRIMARY CARE PROVIDER:  Olen Pel NP  REFERRING PROVIDER: Olen Pel NP  RESPONSIBLE PARTY:   [name], daughter and HCPOA [phone] Eloy End, daughter  [phone]  Due to the COVID-19 crisis, this visit was done via telemedicine and it was initiated and consent by this patient and or family. Patient, 2 daughters, son, and granddaughter were present on Zoom visit.    RECOMMENDATIONS and PLAN:  1.  Dementia.  Patient has not been seen by a PCP in a while and does not have a diagnosis of dementia.  Family reports that she does sundown and is forgetful.  She does know the year, month, city where she lives and her birthdate.  Family states that when she sundowns that she will get anxious and belligerent.  They do state that she will get up at times throughout the night and will wander and have auditory hallucinations.  State that she hears someone knocking at the door or people outside.  Otherwise she is able to walk and do ADLs unassisted and helps with mowing the lawn.  Recommend a low dose of Seroquel at night to help with the restlessness and auditory hallucinations.    2.  Medications.  Went through patient's medications.  She is on risedronate 35 mg weekly and family states that she has been on this for years and was hoping she could stop taking this.  She is taking supplements in which she has 3 sources of Vitamin D totally 3800 units daily.  Recommend blood work to check Vitamin D and calcium levels before stopping risedronate and for finding appropriate level of vitamin D supplementation as she may be getting too much.   3.OA.  Patient takes about 500 to 1000mg  of Tylenol daily for arthritis pain which gives her relief.  Her pain is mostly in her back.  Continue this as this is not an inappropriate  dose and it gives her relief.  4.  Goals of care.  Did not go over ACP today but will at a later date.  Family wants to keep her at home as much as possible due to the pandemic.  Was hoping to get blood work done in home.  Daughter, [name], states that she has the training and supplies to draw blood and would be willing to draw the blood and return the samples to the PCP office or lab for processing if she had the orders.      I spent 60 minutes providing this consultation,  from 2:00 to 3:00. More than 50% of the time in this consultation was spent coordinating communication.   HISTORY OF PRESENT ILLNESS:  Toni Hayes is a 85 y.o. year old female with multiple medical problems including Dementia, HTN, osteoporosis, OA. Palliative Care was asked to help address goals of care.   CODE STATUS:   PPS: 60% HOSPICE ELIGIBILITY/DIAGNOSIS: TBD  PAST MEDICAL HISTORY:  Past Medical History:  Diagnosis Date  . 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 B12 deficiency   . Back pain, thoracic    region  . Carpal tunnel syndrome, right   . Circulatory disease    Personal Hx of unspecified  . Coronary artery calcification seen on CAT scan   . Disorder of bone and cartilage   . Esophageal stricture   . Essential  hypertension   . GERD (gastroesophageal reflux disease)   . Hemorrhage of rectum and anus   . Hx of colonic polyps   . Hyperlipidemia LDL goal <130   . Malignant neoplasm of uterus ([place])   . Sleep disorder   . Urinary frequency     SOCIAL HX:  Social History   Tobacco Use  . Smoking status: Former Smoker    Packs/day: 1.00    Years: 40.00    Pack years: 40.00    Quit date: 11/26/1972    Years since quitting: 46.5  . Smokeless tobacco: Never Used  . Tobacco comment: quit about age 84  Substance Use Topics  . Alcohol use: Yes    Alcohol/week: 1.0 standard drinks    Types: 1 Glasses of wine per week    ALLERGIES:  Allergies  Allergen Reactions  . Codeine      rash  . Meperidine Rash     PERTINENT MEDICATIONS:  Outpatient Encounter Medications as of 06/04/2019  Medication Sig  . acetaminophen (TYLENOL) 500 MG tablet Take 500 mg by mouth every 6 (six) hours as needed for mild pain.  . Calcium Carbonate (CALCIUM 500 PO) Take 1,200 mg by mouth 2 (two) times daily.  . carvedilol (COREG) 3.125 MG tablet PLEASE SEE ATTACHED FOR DETAILED DIRECTIONS  . Cholecalciferol (VITAMIN D3) 2000 units TABS Take 1 tablet by mouth daily.  [name] Calcium (STOOL SOFTENER PO) Take by mouth as needed.  Marland Kitchen estradiol (ESTRACE) 2 MG tablet Take 2 mg by mouth as needed.   . hydrocortisone (ANUSOL-HC) 2.5 % rectal cream Place 1 application rectally 2 (two) times daily.  [name] Oil 500 MG CAPS Take 500 mg by mouth daily.  . metoprolol succinate (TOPROL XL) 25 MG 24 hr tablet Take 1 tablet (25 mg total) by mouth daily.  . Multiple Vitamin (MULTIVITAMIN) tablet Take 1 tablet by mouth daily.  . Probiotic Product (PROBIOTIC DAILY PO) Take by mouth daily.  . risedronate (ACTONEL) 35 MG tablet Take 35 mg by mouth once a week.  Marland Kitchen VITAMIN E PO Take 1 capsule by mouth daily.   No facility-administered encounter medications on file as of 06/04/2019.      Piper Albro Jenetta Downer, NP

## 2019-06-10 ENCOUNTER — Telehealth: Payer: Self-pay

## 2019-06-10 NOTE — Telephone Encounter (Signed)
Message received that script for trazodone was not at pharmacy. Phone call placed to PCP office to make them aware. Script to be sent in to [facility] on [address].

## 2019-06-10 NOTE — Telephone Encounter (Signed)
SW received VM from [place] (patient's daughter-in-law) requesting call back. SW contacted [place]. [name] provided update on patient's status and concerns for increased confusion.SW and [name] discussed goals of care. SW provided emotional support, validated feelings and concerns and used active and reflective listening. [name] also discussed possibility of changing PCP to focus on patient's goal of remaining at home and managing patient's symptoms of increased confusion and agitation. [name] plans to follow-up with Alvester Chou, DNP and request visit. Notified Hanley Ben, NP and Maxwell Caul, triage RN.

## 2019-06-11 ENCOUNTER — Other Ambulatory Visit: Payer: Medicare HMO | Admitting: Adult Health Nurse Practitioner

## 2019-06-11 ENCOUNTER — Other Ambulatory Visit: Payer: Self-pay

## 2019-06-11 DIAGNOSIS — Z515 Encounter for palliative care: Secondary | ICD-10-CM | POA: Diagnosis not present

## 2019-06-11 NOTE — Progress Notes (Signed)
Toni Hayes Consult Note Telephone: [phone]  Fax: [phone]  PATIENT NAME: Toni Hayes DOB: 1924/01/11 MRN: [id number]  PRIMARY CARE PROVIDER:  Olen Pel NP, changing to Alvester Chou NP  REFERRING PROVIDER: Olen Pel NP  RESPONSIBLE PARTY:   Toni Hayes, daughter and HCPOA [phone] Toni Hayes, daughter  [phone]  Due to the COVID-19 crisis, this visit was done via telemedicine and it was initiated and consent by this patient and or family. Video-audio (telehealth) contact was unable to be done due to technical barriers from the patient's side.    RECOMMENDATIONS and PLAN:  1.  Dementia.  Patient is having worsening symptoms.  Family states that she is having more episodes throughout the day in which she is misplacing belongings such as her pocketbook and forgetting where they are and gets frustrated and agitated when daughter tries to help her look for them.  Has accused others of "lifting" her pocketbook. She is having episodes in which she is looking for her husband who has been deceased for years.  Benadryl was tried and this worsened her symptoms.  Trazodone was ordered and family has not had a chance to try this yet.  They are in the process of switching providers (this switch was prompted by current PCP so she is aware that this will eventually happen) and are weighing option to try it before seeing the new provider or waiting to see what the new provider suggests. Told them that it is up to them but it wouldn't hurt to try the trazodone but to watch out for over sedation that could lead to falls.  She is still having auditory hallucinations during the night and seroquel may help with these.  With increasing forgetfulness she may benefit with the addition of Aricept as she is in the early to moderate stages of dementia.  Would suggest starting one at a time to monitor for effectiveness and side effects.    2.  Mobility.   Patient is high functioning and is fairly independent of ADLs.  She is able to mow her own lawn.  With increasing forgetfulness and poorer judgement, she may need to stop this activity for her safety. Family did say she had a fall late last week while walking across the yard to visit a neighboring relative.  No reports of dizziness or instability.  She stepped in a hole and lost her footing. It did take a while for her to get back up on her feet.  She had no injury from the fall.   Family will have to monitor patient for any increased sedation and increased risk of falls with any medication changes.  Will call family soon to arrange for next appointment to go over ACP.  Wanted to wait til the transition of providers was done as not to overwhelm the patient and family.    I spent 45 minutes providing this consultation,  from 1:45 to 2:30. More than 50% of the time in this consultation was spent coordinating communication.   HISTORY OF PRESENT ILLNESS:  Toni Hayes is a 85 y.o. year old female with multiple medical problems including Dementia, HTN, osteoporosis, OA. Palliative Care was asked to help address goals of care.   CODE STATUS:   PPS: 60% HOSPICE ELIGIBILITY/DIAGNOSIS: TBD  PAST MEDICAL HISTORY:  Past Medical History:  Diagnosis Date  . 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 B12 deficiency   . Back  pain, thoracic    region  . Carpal tunnel syndrome, right   . Circulatory disease    Personal Hx of unspecified  . Coronary artery calcification seen on CAT scan   . Disorder of bone and cartilage   . Esophageal stricture   . Essential hypertension   . GERD (gastroesophageal reflux disease)   . Hemorrhage of rectum and anus   . Hx of colonic polyps   . Hyperlipidemia LDL goal <130   . Malignant neoplasm of uterus ([place])   . Sleep disorder   . Urinary frequency     SOCIAL HX:  Social History   Tobacco Use  . Smoking status: Former Smoker    Packs/day: 1.00     Years: 40.00    Pack years: 40.00    Quit date: 11/26/1972    Years since quitting: 46.5  . Smokeless tobacco: Never Used  . Tobacco comment: quit about age 70  Substance Use Topics  . Alcohol use: Yes    Alcohol/week: 1.0 standard drinks    Types: 1 Glasses of wine per week    ALLERGIES:  Allergies  Allergen Reactions  . Codeine     rash  . Meperidine Rash     PERTINENT MEDICATIONS:  Outpatient Encounter Medications as of 06/11/2019  Medication Sig  . acetaminophen (TYLENOL) 500 MG tablet Take 500 mg by mouth every 6 (six) hours as needed for mild pain.  . Calcium Carbonate (CALCIUM 500 PO) Take 1,200 mg by mouth 2 (two) times daily.  . carvedilol (COREG) 3.125 MG tablet PLEASE SEE ATTACHED FOR DETAILED DIRECTIONS  . Cholecalciferol (VITAMIN D3) 2000 units TABS Take 1 tablet by mouth daily.  Toni Hayes Calcium (STOOL SOFTENER PO) Take by mouth as needed.  Marland Kitchen estradiol (ESTRACE) 2 MG tablet Take 2 mg by mouth as needed.   . hydrocortisone (ANUSOL-HC) 2.5 % rectal cream Place 1 application rectally 2 (two) times daily.  Toni Hayes Oil 500 MG CAPS Take 500 mg by mouth daily.  . metoprolol succinate (TOPROL XL) 25 MG 24 hr tablet Take 1 tablet (25 mg total) by mouth daily.  . Multiple Vitamin (MULTIVITAMIN) tablet Take 1 tablet by mouth daily.  . Probiotic Product (PROBIOTIC DAILY PO) Take by mouth daily.  . risedronate (ACTONEL) 35 MG tablet Take 35 mg by mouth once a week.  Marland Kitchen VITAMIN E PO Take 1 capsule by mouth daily.   No facility-administered encounter medications on file as of 06/11/2019.      Toni Hayes Jenetta Downer, NP

## 2019-06-19 DIAGNOSIS — E118 Type 2 diabetes mellitus with unspecified complications: Secondary | ICD-10-CM | POA: Diagnosis not present

## 2019-06-19 DIAGNOSIS — I10 Essential (primary) hypertension: Secondary | ICD-10-CM | POA: Diagnosis not present

## 2019-06-19 DIAGNOSIS — R69 Illness, unspecified: Secondary | ICD-10-CM | POA: Diagnosis not present

## 2019-06-26 ENCOUNTER — Other Ambulatory Visit: Payer: Self-pay | Admitting: Cardiology

## 2019-07-02 ENCOUNTER — Other Ambulatory Visit: Payer: Medicare HMO | Admitting: Adult Health Nurse Practitioner

## 2019-07-02 ENCOUNTER — Other Ambulatory Visit: Payer: Self-pay

## 2019-07-02 DIAGNOSIS — Z515 Encounter for palliative care: Secondary | ICD-10-CM | POA: Diagnosis not present

## 2019-07-03 NOTE — Progress Notes (Signed)
[facility] Consult Note Telephone: [phone]  Fax: [phone]  PATIENT NAME: BRENNYN ORTLIEB DOB: 10-24-1924 MRN: [id number]  PRIMARY CARE PROVIDER:   Alvester Chou NP  REFERRING PROVIDER: Olen Pel NP  RESPONSIBLE PARTY:   [name], daughter and HCPOA [phone] Eloy End, daughter  [phone]     RECOMMENDATIONS and PLAN:  1.  Dementia. FAST 6a.  Patient is able to ambulate without assistive devices.  She still gets on riding mower and helps mow the lawn.  She is forgetful and does needing prompting to properly wash hands.  Is continent of B&B. A&O to person and place.  Will often ask where her late husband is.  She can have sundowning in the afternoon.  Has been started on seroquel 25 mg.  Daughter states that she gives her half on one around 2 or 3 in the afternoon and a whole one at night.  She is sleeping better and not having as many of the auditory hallucinations as she used to.  Daughter did say that at first gave her a whole pill in the afternoon and another at night and she slept for 18 hours straight and that is when she started giving her only half in the afternoon.  Encouraged to continue giving half tab in the afternoon and whole tab at bedtime as this seems to be working well for the patient.  2.  Goals of care.  Patient has DNR in the home and this was scanned and uploaded to [facility].  Left MOST and information sheet about the MOST form with daughter so family can go over it together.  Will call HCPOA and set up appointment in a month to go over MOST  I spent 45 minutes providing this consultation,  from 2:00 to 2:45. More than 50% of the time in this consultation was spent coordinating communication.   HISTORY OF PRESENT ILLNESS:  ALIDA GREINER is a 85 y.o. year old female with multiple medical problems including Dementia, HTN, osteoporosis, OA. Palliative Care was asked to help address goals of care.   CODE STATUS:  DNR  PPS: 60% HOSPICE ELIGIBILITY/DIAGNOSIS: TBD  PHYSICAL EXAM:   General: NAD, frail appearing, thin Cardiovascular: regular rate and rhythm Pulmonary: lung sounds clear; normal respiratory rate Extremities: no edema, no joint deformities Skin: no rashes Neurological: Weakness but otherwise nonfocal; A&O to person and place  PAST MEDICAL HISTORY:  Past Medical History:  Diagnosis Date  . 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 B12 deficiency   . Back pain, thoracic    region  . Carpal tunnel syndrome, right   . Circulatory disease    Personal Hx of unspecified  . Coronary artery calcification seen on CAT scan   . Disorder of bone and cartilage   . Esophageal stricture   . Essential hypertension   . GERD (gastroesophageal reflux disease)   . Hemorrhage of rectum and anus   . Hx of colonic polyps   . Hyperlipidemia LDL goal <130   . Malignant neoplasm of uterus ([place])   . Sleep disorder   . Urinary frequency     SOCIAL HX:  Social History   Tobacco Use  . Smoking status: Former Smoker    Packs/day: 1.00    Years: 40.00    Pack years: 40.00    Quit date: 11/26/1972    Years since quitting: 46.6  . Smokeless tobacco: Never Used  . Tobacco  comment: quit about age 44  Substance Use Topics  . Alcohol use: Yes    Alcohol/week: 1.0 standard drinks    Types: 1 Glasses of wine per week    ALLERGIES:  Allergies  Allergen Reactions  . Codeine     rash  . Meperidine Rash     PERTINENT MEDICATIONS:  Outpatient Encounter Medications as of 07/02/2019  Medication Sig  . acetaminophen (TYLENOL) 500 MG tablet Take 500 mg by mouth every 6 (six) hours as needed for mild pain.  . Calcium Carbonate (CALCIUM 500 PO) Take 1,200 mg by mouth 2 (two) times daily.  . carvedilol (COREG) 3.125 MG tablet PLEASE SEE ATTACHED FOR DETAILED DIRECTIONS  . Cholecalciferol (VITAMIN D3) 2000 units TABS Take 1 tablet by mouth daily.  [name] Calcium (STOOL SOFTENER PO) Take by  mouth as needed.  Marland Kitchen estradiol (ESTRACE) 2 MG tablet Take 2 mg by mouth as needed.   . hydrocortisone (ANUSOL-HC) 2.5 % rectal cream Place 1 application rectally 2 (two) times daily.  [name] Oil 500 MG CAPS Take 500 mg by mouth daily.  . metoprolol succinate (TOPROL-XL) 25 MG 24 hr tablet TAKE 1 TABLET BY MOUTH EVERY DAY  . Multiple Vitamin (MULTIVITAMIN) tablet Take 1 tablet by mouth daily.  . Probiotic Product (PROBIOTIC DAILY PO) Take by mouth daily.  . risedronate (ACTONEL) 35 MG tablet Take 35 mg by mouth once a week.  Marland Kitchen VITAMIN E PO Take 1 capsule by mouth daily.   No facility-administered encounter medications on file as of 07/02/2019.       [place] Jenetta Downer, NP

## 2019-07-08 DIAGNOSIS — E785 Hyperlipidemia, unspecified: Secondary | ICD-10-CM | POA: Diagnosis not present

## 2019-07-08 DIAGNOSIS — R009 Unspecified abnormalities of heart beat: Secondary | ICD-10-CM | POA: Diagnosis not present

## 2019-07-08 DIAGNOSIS — R69 Illness, unspecified: Secondary | ICD-10-CM | POA: Diagnosis not present

## 2019-07-08 DIAGNOSIS — Z1329 Encounter for screening for other suspected endocrine disorder: Secondary | ICD-10-CM | POA: Diagnosis not present

## 2019-07-08 DIAGNOSIS — J449 Chronic obstructive pulmonary disease, unspecified: Secondary | ICD-10-CM | POA: Diagnosis not present

## 2019-07-08 DIAGNOSIS — I10 Essential (primary) hypertension: Secondary | ICD-10-CM | POA: Diagnosis not present

## 2019-07-08 DIAGNOSIS — E118 Type 2 diabetes mellitus with unspecified complications: Secondary | ICD-10-CM | POA: Diagnosis not present

## 2019-07-09 DIAGNOSIS — R69 Illness, unspecified: Secondary | ICD-10-CM | POA: Diagnosis not present

## 2019-07-14 ENCOUNTER — Telehealth: Payer: Self-pay | Admitting: Adult Health Nurse Practitioner

## 2019-07-14 ENCOUNTER — Telehealth: Payer: Self-pay

## 2019-07-14 NOTE — Telephone Encounter (Signed)
Scheduled next appointment for August 26 at 3pm Aislin Onofre K. Olena Heckle NP

## 2019-07-14 NOTE — Telephone Encounter (Signed)
Spoke with patient's daughter in law about MOST form and answered questions.  Also concerns for increasing forgetfulness and confusion as patient forgot who her daughter was.  Also concern that last time she used the riding mower the patient started driving down the road on it.  Fortunately no one was hurt but a frightening experience.  Have instructed to take/hide mower keys from patient.  Did instruct that this will probably agitate her but it is for her safety.  Try to redirect her when she gets agitated.  Will reach out to daughter tomorrow and offer support.  Will reach out to SW for caregiver resources on dementia. Toni Hayes K. Olena Heckle NP

## 2019-07-14 NOTE — Telephone Encounter (Signed)
Spoke with patient's daughter about changes in her cognition and went over dementia progression and what to expect and some techniques on handling her behavior.  Discussed taking the keys away to the riding mower for her safety.  Sent daughter an email with link on dementia and what to expect and contact info to reach out to me with any questions or concerns. Olena Willy K. Olena Heckle NP

## 2019-07-14 NOTE — Telephone Encounter (Signed)
SW received call from (patient's daughter-in-law) requesting SW contact Toni Hayes (patient's daughter) to offer support given patient's increased confusion.  SW contacted Toni Hayes to discuss and offer visit. Toni Hayes reports that patient has had increased confusion and said that she felt like last week that they were too busy and it was too overwhelming for patient. Pat and SW discussed disease progression and changes that occur with dementia. SW provided education on distraction techniques and de-escalating. SW also acknowledged and validated Pat's role as caregiver. Pat and SW also discussed the emotional aspect of caring for a loved one with dementia. Toni Hayes denies need for visit this week but open to SW visiting with NP. SW notified NP.

## 2019-07-22 ENCOUNTER — Other Ambulatory Visit: Payer: Medicare HMO

## 2019-07-22 ENCOUNTER — Other Ambulatory Visit: Payer: Medicare HMO | Admitting: Adult Health Nurse Practitioner

## 2019-07-22 ENCOUNTER — Other Ambulatory Visit: Payer: Self-pay

## 2019-07-22 DIAGNOSIS — Z515 Encounter for palliative care: Secondary | ICD-10-CM

## 2019-07-22 NOTE — Progress Notes (Signed)
[facility] Consult Note Telephone: [phone]  Fax: [phone]  PATIENT NAME: BIRDINE HANDLEY DOB: [birth year] MRN: [id number]  PRIMARY CARE PROVIDER:   Alvester Chou NP  REFERRING PROVIDER: Olen Pel NP  RESPONSIBLE PARTY:   [name], daughter and HCPOA [phone] Eloy End, daughter [phone]     RECOMMENDATIONS and PLAN:  1.  Advanced care planning.  Patient is a DNR.  MOST filled out today indicating limited hospital interventions, antibiotics for as determined for infection, IV fluids for a trial, and no feeding tube.  MOST was uploaded to [facility] and original left with patient/family.  2.  Dementia.  FAST 6a.  Patient is able to ambulate without assistive devices.  Continent of B&B. A&O to person and place.  Most of today's visit was spent discussing disease progression and how to handle behaviors associated with dementia.  Discussed methods such as distraction and using activities that are easy such as reading books at a lower education level.  Recently had to take the keys away from riding mower as she was found driving down the road on the mower.  This has been difficult and patient was able to start the mower with a key that was not one of the mower keys.  Her daughter was able to keep her in the back yard away from the road but have since taken all the keys away.  Family expressed understanding of the things discussed today.  Family has my contact info if they have any questions or concerns.    I spent 60 minutes providing this consultation,  from 3:00 to 4:00. More than 50% of the time in this consultation was spent coordinating communication.   HISTORY OF PRESENT ILLNESS:  VIOLETT OHAYON is a 85 y.o. year old female with multiple medical problems including Dementia, HTN, osteoporosis, OA Palliative Care was asked to help address goals of care.   CODE STATUS: DNR  PPS: 60% HOSPICE ELIGIBILITY/DIAGNOSIS: TBD  PAST  MEDICAL HISTORY:  Past Medical History:  Diagnosis Date  . 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 B12 deficiency   . Back pain, thoracic    region  . Carpal tunnel syndrome, right   . Circulatory disease    Personal Hx of unspecified  . Coronary artery calcification seen on CAT scan   . Disorder of bone and cartilage   . Esophageal stricture   . Essential hypertension   . GERD (gastroesophageal reflux disease)   . Hemorrhage of rectum and anus   . Hx of colonic polyps   . Hyperlipidemia LDL goal <130   . Malignant neoplasm of uterus ([place])   . Sleep disorder   . Urinary frequency     SOCIAL HX:  Social History   Tobacco Use  . Smoking status: Former Smoker    Packs/day: 1.00    Years: 40.00    Pack years: 40.00    Quit date: 11/26/1972    Years since quitting: 46.6  . Smokeless tobacco: Never Used  . Tobacco comment: quit about age 71  Substance Use Topics  . Alcohol use: Yes    Alcohol/week: 1.0 standard drinks    Types: 1 Glasses of wine per week    ALLERGIES:  Allergies  Allergen Reactions  . Codeine     rash  . Meperidine Rash     PERTINENT MEDICATIONS:  Outpatient Encounter Medications as of 07/22/2019  Medication Sig  . acetaminophen (TYLENOL)  500 MG tablet Take 500 mg by mouth every 6 (six) hours as needed for mild pain.  . Calcium Carbonate (CALCIUM 500 PO) Take 1,200 mg by mouth 2 (two) times daily.  . carvedilol (COREG) 3.125 MG tablet PLEASE SEE ATTACHED FOR DETAILED DIRECTIONS  . Cholecalciferol (VITAMIN D3) 2000 units TABS Take 1 tablet by mouth daily.  [name] Calcium (STOOL SOFTENER PO) Take by mouth as needed.  Marland Kitchen estradiol (ESTRACE) 2 MG tablet Take 2 mg by mouth as needed.   . hydrocortisone (ANUSOL-HC) 2.5 % rectal cream Place 1 application rectally 2 (two) times daily.  [name] Oil 500 MG CAPS Take 500 mg by mouth daily.  . metoprolol succinate (TOPROL-XL) 25 MG 24 hr tablet TAKE 1 TABLET BY MOUTH EVERY DAY  . Multiple  Vitamin (MULTIVITAMIN) tablet Take 1 tablet by mouth daily.  . Probiotic Product (PROBIOTIC DAILY PO) Take by mouth daily.  . risedronate (ACTONEL) 35 MG tablet Take 35 mg by mouth once a week.  Marland Kitchen VITAMIN E PO Take 1 capsule by mouth daily.   No facility-administered encounter medications on file as of 07/22/2019.      Klani Caridi Jenetta Downer, NP

## 2019-07-22 NOTE — Progress Notes (Signed)
COMMUNITY PALLIATIVE CARE SW NOTE  PATIENT NAME: Toni Hayes DOB: 1924-08-21 MRN: [id number]  PRIMARY CARE PROVIDER: Deland Pretty, MD  RESPONSIBLE PARTY:  Acct ID - Guarantor Home Phone Work Phone Relationship Acct Type  [id number] Loann Quill E [phone] Self P/F     [address], [place], [place] [address]     PLAN OF CARE and INTERVENTIONS:             1. GOALS OF CARE/ ADVANCE CARE PLANNING:  Patient is a DNR. Daughter-in-law, [name] is HCPOA. Goal is to keep patient managed at home with daughter. MOST form completed by Amy. 2. SOCIAL/EMOTIONAL/SPIRITUAL ASSESSMENT/ INTERVENTIONS:  SW and Amy, NP met with patient, Toni Hayes (patient's daughter), [name] (patient's daughter-in-law) and son in the home. Patient greeted team. Patient was engaged, oriented to self. Toni Hayes discussed patient's cognitive changes recently and challenges with calming and redirecting patient. Toni Hayes shared her accounts and how she has handled it with patient, specifically when patient asks for her husband (who passed away 13 years ago). Team validated Patricia's role and response, and encouraged Toni Hayes to use distractions techniques and brief, comforting responses to patient's questions. Patient is sleeping well, eating "so-so". Patient said she likes ice cream with chocolate syrup and nuts. Patient enjoys crocheting and reading. Amy provided education on dementia. Team discussed care in the home with routines, and continued provided education on safety precautions. Family appreciative of palliative care team support and information.   3. PATIENT/CAREGIVER EDUCATION/ COPING:  Patient and family continue to work on Radiographer, therapeutic. Family support one another and provide relief, when needed. Family also enjoys spending time outside in the yard.  4. PERSONAL EMERGENCY PLAN:  Family will call 9-1-1 for emergencies.  5. COMMUNITY RESOURCES COORDINATION/ HEALTH CARE NAVIGATION:  Family coordinates patient care.  Alvester Chou, NP scheduled to follow-up with patient at the end of September. No concerns. 6. FINANCIAL/LEGAL CONCERNS/INTERVENTIONS:  None.     SOCIAL HX:  Social History   Tobacco Use  . Smoking status: Former Smoker    Packs/day: 1.00    Years: 40.00    Pack years: 40.00    Quit date: 11/26/1972    Years since quitting: 46.6  . Smokeless tobacco: Never Used  . Tobacco comment: quit about age 80  Substance Use Topics  . Alcohol use: Yes    Alcohol/week: 1.0 standard drinks    Types: 1 Glasses of wine per week    CODE STATUS:   Code Status: Prior (DNR) ADVANCED DIRECTIVES: Y MOST FORM COMPLETE:  Yes. HOSPICE EDUCATION PROVIDED: None.  PPS: Patient is independent of most ADLs. Standby assist for safety.   I spent 60 minutes with patient/family, from 3:00-4:00p providing education, support and consultation.    Margaretmary Lombard, LCSW

## 2019-07-23 ENCOUNTER — Other Ambulatory Visit: Payer: Self-pay

## 2019-08-19 ENCOUNTER — Other Ambulatory Visit: Payer: Medicare HMO | Admitting: Hospice

## 2019-08-19 DIAGNOSIS — Z515 Encounter for palliative care: Secondary | ICD-10-CM

## 2019-08-19 NOTE — Progress Notes (Addendum)
    [place] Consult Note Telephone: [phone]  Fax: [phone]  PATIENT NAME: Toni Hayes DOB: [birth year] MRN: [id number]  PRIMARY CARE PROVIDER:   Alvester Chou DNP REFERRING PROVIDER:  Deland Pretty, [address] [place],  [place] [address]  RESPONSIBLE PARTY:   Toni Hayes, daughter-in-law and HCPOA [phone] Toni Hayes, daughter [phone]    RECOMMENDATIONS/PLAN:  1. Advance Care Planning/Goals of Care: Goals include to maximize quality of life and symptom management. DNR and MOSt forms in the home and in [place]. MOST selections incllude limited hospital interventions, antibiotics  as determined for infection, IV fluids for a trial, and no feeding tube. Goals of care further discussed and clarified.  2. Symptom management: Memory loss and occasional agitation related to Dementia.  FAST 6a. Recently started on Seroquel by PCP; this is well tolerated and effective. Family anticipating timing may change in the future when agitation occurs in the morning time. Current 2pm and 9pm doses to continue as ordered. Emotional support and education on supporting care, redirection provided.  3. Follow up Palliative Care Visit: Palliative care will continue to follow for goals of care clarification and symptom management.   I spent 60 minutes providing this consultation, from 3.00pm to 4.00pm  More than 50% of the time in this consultation was spent on coordinating advance care communication, chart review, patient /family education.  HISTORY OF PRESENT ILLNESS:  WILLESHA AQUINO is a 85 y.o. year old female with multiple medical problems including Dementia, HTN, osteoporosis, OA Palliative Care was asked to help address goals of care.   CODE STATUS: DNR  PPS: 60% HOSPICE ELIGIBILITY/DIAGNOSIS: TBD  PAST MEDICAL HISTORY:  Past Medical History:  Diagnosis Date  . 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 B12  deficiency   . Back pain, thoracic    region  . Carpal tunnel syndrome, right   . Circulatory disease    Personal Hx of unspecified  . Coronary artery calcification seen on CAT scan   . Disorder of bone and cartilage   . Esophageal stricture   . Essential hypertension   . GERD (gastroesophageal reflux disease)   . Hemorrhage of rectum and anus   . Hx of colonic polyps   . Hyperlipidemia LDL goal <130   . Malignant neoplasm of uterus ([place])   . Sleep disorder   . Urinary frequency     SOCIAL HX:  Social History   Tobacco Use  . Smoking status: Former Smoker    Packs/day: 1.00    Years: 40.00    Pack years: 40.00    Quit date: 11/26/1972    Years since quitting: 46.7  . Smokeless tobacco: Never Used  . Tobacco comment: quit about age 60  Substance Use Topics  . Alcohol use: Yes    Alcohol/week: 1.0 standard drinks    Types: 1 Glasses of wine per week    ALLERGIES:  Allergies  Allergen Reactions  . Codeine     rash  . Meperidine Rash     PHYSICAL EXAM:   General: Pleasant, cooperative, in no acute distress Cardiovascular: regular rate and rhythm Pulmonary: clear to auscultation, no SOB Abdomen: soft, nontender, + bowel sounds in all quadrants GU: no suprapubic tenderness Extremities: no edema, no joint deformities Skin: no rashes/wounds on exposed skin Neurological: Alert and oriented x 2  Teodoro Spray, NP

## 2019-08-20 ENCOUNTER — Other Ambulatory Visit: Payer: Self-pay

## 2019-08-25 DIAGNOSIS — E118 Type 2 diabetes mellitus with unspecified complications: Secondary | ICD-10-CM | POA: Diagnosis not present

## 2019-08-25 DIAGNOSIS — R69 Illness, unspecified: Secondary | ICD-10-CM | POA: Diagnosis not present

## 2019-08-25 DIAGNOSIS — I10 Essential (primary) hypertension: Secondary | ICD-10-CM | POA: Diagnosis not present

## 2019-09-24 ENCOUNTER — Other Ambulatory Visit: Payer: Self-pay

## 2019-09-24 ENCOUNTER — Other Ambulatory Visit: Payer: Medicare HMO | Admitting: Hospice

## 2019-09-24 DIAGNOSIS — Z515 Encounter for palliative care: Secondary | ICD-10-CM | POA: Diagnosis not present

## 2019-09-24 NOTE — Progress Notes (Signed)
[facility] Consult Note Telephone: [phone]  Fax: [phone]  PATIENT NAME: Toni Hayes DOB: [birth year] MRN: [id number]  PRIMARY CARE PROVIDER:   Deland Pretty, MD  REFERRING PROVIDER:  Deland Pretty, MD [address] [place],  [place] [address]  RESPONSIBLE PARTY:Claire, daughter-in-law and Chauncey [place] [phone] Eloy End, daughter [phone]  TELEHEALTH VISIT STATEMENT Due to the COVID-19 crisis, this visit was done via telephone from my office. It was initiated and consented to by this patient and/or family     RECOMMENDATIONS/PLAN:  1. Advance Care Planning/Goals of Care: Telehealth consisted of building trust and discussing goals of care which include to maximize quality of life and symptom management. DNR and MOSt forms in the home and in [place]. MOST selections incllude limited hospital interventions, antibiotics  as determined for infection, IV fluids for a trial, and no feeding tube. Patient, HPOA, daughter [name] all present during visit, with no complaints/concerns.  2. Symptom management: Memory loss and occasional agitation related to Dementia at baseline.  FAST 6a. Changing in timing of Seroquel is effective in managing agitation, currently being given at 9am and 2pm. Emotional support and education on supporting care  Provided. 3. Follow up Palliative Care Visit: Palliative care will continue to follow for goals of care clarification and symptom management.   I spent 30 minutes providing this consultation, from 9.00am to 9.30am.  More than 50% of the time in this consultation was spent on coordinating communication.  HISTORY OF PRESENT ILLNESS:Toni E Wylieis a 85 y.o.year oldfemalewith multiple medical problems including Dementia, HTN, osteoporosis, OAPalliative Care was asked to help address goals of care.   CODE STATUS: DNR  PPS: 60% HOSPICE ELIGIBILITY/DIAGNOSIS: TBD  PAST  MEDICAL HISTORY:  Past Medical History:  Diagnosis Date  . 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 B12 deficiency   . Back pain, thoracic    region  . Carpal tunnel syndrome, right   . Circulatory disease    Personal Hx of unspecified  . Coronary artery calcification seen on CAT scan   . Disorder of bone and cartilage   . Esophageal stricture   . Essential hypertension   . GERD (gastroesophageal reflux disease)   . Hemorrhage of rectum and anus   . Hx of colonic polyps   . Hyperlipidemia LDL goal <130   . Malignant neoplasm of uterus ([place])   . Sleep disorder   . Urinary frequency     SOCIAL HX:  Social History   Tobacco Use  . Smoking status: Former Smoker    Packs/day: 1.00    Years: 40.00    Pack years: 40.00    Quit date: 11/26/1972    Years since quitting: 46.8  . Smokeless tobacco: Never Used  . Tobacco comment: quit about age 35  Substance Use Topics  . Alcohol use: Yes    Alcohol/week: 1.0 standard drinks    Types: 1 Glasses of wine per week    ALLERGIES:  Allergies  Allergen Reactions  . Codeine     rash  . Meperidine Rash     PERTINENT MEDICATIONS:  Outpatient Encounter Medications as of 09/24/2019  Medication Sig  . acetaminophen (TYLENOL) 500 MG tablet Take 500 mg by mouth every 6 (six) hours as needed for mild pain.  . Calcium Carbonate (CALCIUM 500 PO) Take 1,200 mg by mouth 2 (two) times daily.  . carvedilol (COREG) 3.125 MG tablet PLEASE SEE ATTACHED FOR  DETAILED DIRECTIONS  . Cholecalciferol (VITAMIN D3) 2000 units TABS Take 1 tablet by mouth daily.  [name] Calcium (STOOL SOFTENER PO) Take by mouth as needed.  Marland Kitchen estradiol (ESTRACE) 2 MG tablet Take 2 mg by mouth as needed.   . hydrocortisone (ANUSOL-HC) 2.5 % rectal cream Place 1 application rectally 2 (two) times daily.  [name] Oil 500 MG CAPS Take 500 mg by mouth daily.  . metoprolol succinate (TOPROL-XL) 25 MG 24 hr tablet TAKE 1 TABLET BY MOUTH EVERY DAY  . Multiple  Vitamin (MULTIVITAMIN) tablet Take 1 tablet by mouth daily.  . Probiotic Product (PROBIOTIC DAILY PO) Take by mouth daily.  . risedronate (ACTONEL) 35 MG tablet Take 35 mg by mouth once a week.  Marland Kitchen VITAMIN E PO Take 1 capsule by mouth daily.   No facility-administered encounter medications on file as of 09/24/2019.     Teodoro Spray, NP

## 2019-10-29 ENCOUNTER — Other Ambulatory Visit: Payer: Medicare HMO | Admitting: Hospice

## 2019-10-29 ENCOUNTER — Other Ambulatory Visit: Payer: Self-pay

## 2019-10-29 DIAGNOSIS — Z515 Encounter for palliative care: Secondary | ICD-10-CM | POA: Diagnosis not present

## 2019-10-29 DIAGNOSIS — F0391 Unspecified dementia with behavioral disturbance: Secondary | ICD-10-CM

## 2019-10-29 NOTE — Progress Notes (Signed)
[facility] Consult Note Telephone: [phone]  Fax: [phone]  PATIENT NAME: Toni Hayes DOB: [birth year] MRN: [id number]  PRIMARY CARE PROVIDER:   Deland Pretty, MD  REFERRING PROVIDER:  Deland Pretty, MD [address] [place],  [place] [address]  [place], [place] Eloy End, daughter [phone]  TELEHEALTH VISIT STATEMENT Due to the COVID-19 crisis, this visit was done via telephone from my office. It was initiated and consented to by this patient and/or family     RECOMMENDATIONS/PLAN: 1. Advance Care Planning/Goals of Care: Telehealth conference call with [name], patient and [name] consisted of building trust and discussing changes in patient's health status. Patient is fairly stable at this time She remains a DNR and goals of care include to maximize quality of life and symptom management. 2. Symptom management:Memory loss at baseline; FAST 6a; agitation well managed with Seroquel. [name] reports she gives the 9am dose in halves -  in the morning and in the afternoon and then the 9pm dose in full. This has been effective. Patient helps out in chores around the hose and participates in puzzle activity. Ample emotional support provided; ongoing  supportive care encouraged. [name] mentioned she is going to reorder over the counter [employer] to [employer]. Patient has used this for decades for regular bowel maintenance.  3. Follow up Palliative Care Visit: Palliative care will continue to follow for goals of care clarification and symptom management.   I spent12minutes providing this consultation, from 9.00am to 9.40am. More than 50% of the time in this consultation was spent on coordinating communication.  HISTORY OF PRESENT ILLNESS:Toni E Wylieis a 85 y.o.year oldfemalewith multiple medical problems including Dementia, HTN, osteoporosis,  OAPalliative Care was asked to help address goals of care.  CODE STATUS: DNR  PPS: 60% HOSPICE ELIGIBILITY/DIAGNOSIS: TBD  PAST MEDICAL HISTORY:  Past Medical History:  Diagnosis Date  . 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 B12 deficiency   . Back pain, thoracic    region  . Carpal tunnel syndrome, right   . Circulatory disease    Personal Hx of unspecified  . Coronary artery calcification seen on CAT scan   . Disorder of bone and cartilage   . Esophageal stricture   . Essential hypertension   . GERD (gastroesophageal reflux disease)   . Hemorrhage of rectum and anus   . Hx of colonic polyps   . Hyperlipidemia LDL goal <130   . Malignant neoplasm of uterus ([place])   . Sleep disorder   . Urinary frequency     SOCIAL HX:  Social History   Tobacco Use  . Smoking status: Former Smoker    Packs/day: 1.00    Years: 40.00    Pack years: 40.00    Quit date: 11/26/1972    Years since quitting: 46.9  . Smokeless tobacco: Never Used  . Tobacco comment: quit about age 68  Substance Use Topics  . Alcohol use: Yes    Alcohol/week: 1.0 standard drinks    Types: 1 Glasses of wine per week    ALLERGIES:  Allergies  Allergen Reactions  . Codeine     rash  . Meperidine Rash     PERTINENT MEDICATIONS:  Outpatient Encounter Medications as of 10/29/2019  Medication Sig  . acetaminophen (TYLENOL) 500 MG tablet Take 500 mg by mouth every 6 (six) hours as needed for mild pain.  . Calcium Carbonate (CALCIUM 500  PO) Take 1,200 mg by mouth 2 (two) times daily.  . carvedilol (COREG) 3.125 MG tablet PLEASE SEE ATTACHED FOR DETAILED DIRECTIONS  . Cholecalciferol (VITAMIN D3) 2000 units TABS Take 1 tablet by mouth daily.  [name] Calcium (STOOL SOFTENER PO) Take by mouth as needed.  Marland Kitchen estradiol (ESTRACE) 2 MG tablet Take 2 mg by mouth as needed.   . hydrocortisone (ANUSOL-HC) 2.5 % rectal cream Place 1 application rectally 2 (two) times daily.  [name] Oil 500 MG CAPS  Take 500 mg by mouth daily.  . metoprolol succinate (TOPROL-XL) 25 MG 24 hr tablet TAKE 1 TABLET BY MOUTH EVERY DAY  . Multiple Vitamin (MULTIVITAMIN) tablet Take 1 tablet by mouth daily.  . Probiotic Product (PROBIOTIC DAILY PO) Take by mouth daily.  . risedronate (ACTONEL) 35 MG tablet Take 35 mg by mouth once a week.  Marland Kitchen VITAMIN E PO Take 1 capsule by mouth daily.   No facility-administered encounter medications on file as of 10/29/2019.     Teodoro Spray, NP

## 2019-12-07 ENCOUNTER — Telehealth: Payer: Self-pay | Admitting: Hospice

## 2019-12-07 NOTE — Telephone Encounter (Signed)
Spoke with Adamina Lavelle (daughter-in-law) about rescheduling the 12/14/19 f/u visit due to the office being closed that day, this was rescheduled for 12/17/19 @ 3:30 PM.

## 2019-12-17 ENCOUNTER — Other Ambulatory Visit: Payer: Medicare HMO | Admitting: Hospice

## 2019-12-17 ENCOUNTER — Other Ambulatory Visit: Payer: Self-pay

## 2019-12-17 DIAGNOSIS — Z515 Encounter for palliative care: Secondary | ICD-10-CM | POA: Diagnosis not present

## 2019-12-17 DIAGNOSIS — F0391 Unspecified dementia with behavioral disturbance: Secondary | ICD-10-CM

## 2019-12-17 NOTE — Progress Notes (Signed)
[facility] Consult Note Telephone: [phone]  Fax: [phone]  PATIENT NAME: CAROLYNE ZIERKE DOB: [birth year] MRN: [id number]  PRIMARY CARE PROVIDER:   Deland Pretty, MD  REFERRING PROVIDER:  Deland Pretty, MD [address],  [place] [address]  [place], [place] Eloy End, daughter [phone]  TELEHEALTH VISIT STATEMENT Due to the COVID-19 crisis, this visit was done via telephone from my office. It was initiated and consented to by this patient and/or family   RECOMMENDATIONS/PLAN: 1. Advance Care Planning/Goals of Care:Telehealth conference call with [name], patient and [name] consisted of building trust and follow up on palliaitve care. Patient is fairly stable at this time She remains a DNR and goals of care include tomaximize quality of life and symptom management. 2. Symptom management:Patient's memory loss r/t Dementia at baseline; FAST 6a; agitation well managed with Seroquel. [name] continues to give 9am dose in halves -  in the morning and in the afternoon and then the 9pm dose in full. This has been effective. Patient is ambulatory without device, helps out in chores around the hose and participates in puzzle activity. Continuing to take  probiotic; having regular movements. Patient has received first dose of COVID 19 vaccine and second dose scheduled for 12/31/2019. Patient tolerated vaccine okay. Encouraged ongoing supportive care.  3. Follow up Palliative Care Visit: Palliative care will continue to follow for goals of care clarification and symptom management.   I spent49minutes providing this consultation.More than 50% of the time in this consultation was spent on coordinating communication.  HISTORY OF PRESENT ILLNESS:Mayda E Wylieis a 86 y.o.year oldfemalewith multiple medical problems including Dementia, HTN, osteoporosis,  OAPalliative Care was asked to help address goals of care.  CODE STATUS: DNR  PPS: 60% HOSPICE ELIGIBILITY/DIAGNOSIS: TBD  PAST MEDICAL HISTORY:  Past Medical History:  Diagnosis Date  . 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 B12 deficiency   . Back pain, thoracic    region  . Carpal tunnel syndrome, right   . Circulatory disease    Personal Hx of unspecified  . Coronary artery calcification seen on CAT scan   . Disorder of bone and cartilage   . Esophageal stricture   . Essential hypertension   . GERD (gastroesophageal reflux disease)   . Hemorrhage of rectum and anus   . Hx of colonic polyps   . Hyperlipidemia LDL goal <130   . Malignant neoplasm of uterus ([place])   . Sleep disorder   . Urinary frequency     SOCIAL HX:  Social History   Tobacco Use  . Smoking status: Former Smoker    Packs/day: 1.00    Years: 40.00    Pack years: 40.00    Quit date: 11/26/1972    Years since quitting: 47.0  . Smokeless tobacco: Never Used  . Tobacco comment: quit about age 89  Substance Use Topics  . Alcohol use: Yes    Alcohol/week: 1.0 standard drinks    Types: 1 Glasses of wine per week    ALLERGIES:  Allergies  Allergen Reactions  . Codeine     rash  . Meperidine Rash     PERTINENT MEDICATIONS:  Outpatient Encounter Medications as of 12/17/2019  Medication Sig  . acetaminophen (TYLENOL) 500 MG tablet Take 500 mg by mouth every 6 (six) hours as needed for mild pain.  . Calcium Carbonate (CALCIUM 500 PO) Take 1,200 mg by mouth  2 (two) times daily.  . carvedilol (COREG) 3.125 MG tablet PLEASE SEE ATTACHED FOR DETAILED DIRECTIONS  . Cholecalciferol (VITAMIN D3) 2000 units TABS Take 1 tablet by mouth daily.  [name] Calcium (STOOL SOFTENER PO) Take by mouth as needed.  Marland Kitchen estradiol (ESTRACE) 2 MG tablet Take 2 mg by mouth as needed.   . hydrocortisone (ANUSOL-HC) 2.5 % rectal cream Place 1 application rectally 2 (two) times daily.  [name] Oil 500 MG CAPS  Take 500 mg by mouth daily.  . metoprolol succinate (TOPROL-XL) 25 MG 24 hr tablet TAKE 1 TABLET BY MOUTH EVERY DAY  . Multiple Vitamin (MULTIVITAMIN) tablet Take 1 tablet by mouth daily.  . Probiotic Product (PROBIOTIC DAILY PO) Take by mouth daily.  . risedronate (ACTONEL) 35 MG tablet Take 35 mg by mouth once a week.  Marland Kitchen VITAMIN E PO Take 1 capsule by mouth daily.   No facility-administered encounter medications on file as of 12/17/2019.    Teodoro Spray, NP

## 2020-01-13 ENCOUNTER — Other Ambulatory Visit: Payer: Self-pay

## 2020-01-13 ENCOUNTER — Other Ambulatory Visit: Payer: Medicare HMO | Admitting: Hospice

## 2020-01-13 DIAGNOSIS — Z515 Encounter for palliative care: Secondary | ICD-10-CM

## 2020-01-13 DIAGNOSIS — F0391 Unspecified dementia with behavioral disturbance: Secondary | ICD-10-CM

## 2020-01-13 NOTE — Progress Notes (Signed)
[facility] Consult Note Telephone: [phone]  Fax: [phone]  PATIENT NAME: Toni Hayes DOB: 1924/09/24 MRN: [id number]  PRIMARY CARE PROVIDER:   Deland Pretty, MD  REFERRING PROVIDER:  Deland Pretty, MD [address] [place],  [place] [address]  [place], [place] Eloy End, daughter [phone]  TELEHEALTH VISIT STATEMENT Due to the COVID-19 crisis, this visit was done via telephone from my office. It was initiated and consented to by this patient and/or family   RECOMMENDATIONS/PLAN: 1. Advance Care Planning/Goals of [place] - called reporting patient with elevated blood pressure and 'not looking herself' She said she does not want hospital visit per patient's wishes. Spoke with caregiver at home with patient who said patient stable at this time, resting in bed. Patient is a DNR and goals of care include tomaximize quality of life and symptom management. 2.Symptom management: Elevated BP came down when caregiver was asked to recheck while on telehealth visit. No headaches, dysarthria, facial assymetry, no chest pain/discomfort; patient at baseline. Encouraged caregiver to make sure patient taking her Metoprolol and Coreg as ordered; to be sure she swallows since patient sometimes drops medication on the floor. Caregiver to monitor closely and call with any concerns. Patient's memory loss r/t Dementiaat baseline; FAST 6a; agitation well managed with Seroquel. [name] continues to give 9am dose in halves - in the morning and in the afternoon and then the 9pm dose in full. This has been effective. . Encouraged ongoing supportive care.  3. Follow up Palliative Care Visit: Palliative care will continue to follow for goals of care clarification and symptom management. Televisit scheduled 01/18/20, per request from family. I spent53minutes providing  this consultation; time includes chart review and documentation. More than 50% of the time in this consultation was spent on coordinating communication.  HISTORY OF PRESENT ILLNESS:Toni E Wylieis a 86 y.o.year oldfemalewith multiple medical problems including Dementia, HTN, osteoporosis, OAPalliative Care was asked to help address goals of care.  CODE STATUS: DNR  PPS: 60% HOSPICE ELIGIBILITY/DIAGNOSIS: TBD  PAST MEDICAL HISTORY:  Past Medical History:  Diagnosis Date  . 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 B12 deficiency   . Back pain, thoracic    region  . Carpal tunnel syndrome, right   . Circulatory disease    Personal Hx of unspecified  . Coronary artery calcification seen on CAT scan   . Disorder of bone and cartilage   . Esophageal stricture   . Essential hypertension   . GERD (gastroesophageal reflux disease)   . Hemorrhage of rectum and anus   . Hx of colonic polyps   . Hyperlipidemia LDL goal <130   . Malignant neoplasm of uterus ([place])   . Sleep disorder   . Urinary frequency     SOCIAL HX:  Social History   Tobacco Use  . Smoking status: Former Smoker    Packs/day: 1.00    Years: 40.00    Pack years: 40.00    Quit date: 11/26/1972    Years since quitting: 47.1  . Smokeless tobacco: Never Used  . Tobacco comment: quit about age 24  Substance Use Topics  . Alcohol use: Yes    Alcohol/week: 1.0 standard drinks    Types: 1 Glasses of wine per week    ALLERGIES:  Allergies  Allergen Reactions  . Codeine     rash  . Meperidine Rash     PERTINENT  MEDICATIONS:  Outpatient Encounter Medications as of 01/13/2020  Medication Sig  . acetaminophen (TYLENOL) 500 MG tablet Take 500 mg by mouth every 6 (six) hours as needed for mild pain.  . Calcium Carbonate (CALCIUM 500 PO) Take 1,200 mg by mouth 2 (two) times daily.  . carvedilol (COREG) 3.125 MG tablet PLEASE SEE ATTACHED FOR DETAILED DIRECTIONS  . Cholecalciferol (VITAMIN D3) 2000  units TABS Take 1 tablet by mouth daily.  [name] Calcium (STOOL SOFTENER PO) Take by mouth as needed.  Marland Kitchen estradiol (ESTRACE) 2 MG tablet Take 2 mg by mouth as needed.   . hydrocortisone (ANUSOL-HC) 2.5 % rectal cream Place 1 application rectally 2 (two) times daily.  [name] Oil 500 MG CAPS Take 500 mg by mouth daily.  . metoprolol succinate (TOPROL-XL) 25 MG 24 hr tablet TAKE 1 TABLET BY MOUTH EVERY DAY  . Multiple Vitamin (MULTIVITAMIN) tablet Take 1 tablet by mouth daily.  . Probiotic Product (PROBIOTIC DAILY PO) Take by mouth daily.  . risedronate (ACTONEL) 35 MG tablet Take 35 mg by mouth once a week.  Marland Kitchen VITAMIN E PO Take 1 capsule by mouth daily.   No facility-administered encounter medications on file as of 01/13/2020.     Teodoro Spray, NP

## 2020-01-18 ENCOUNTER — Other Ambulatory Visit: Payer: Medicare HMO | Admitting: Hospice

## 2020-01-18 ENCOUNTER — Other Ambulatory Visit: Payer: Self-pay

## 2020-01-18 DIAGNOSIS — Z515 Encounter for palliative care: Secondary | ICD-10-CM | POA: Diagnosis not present

## 2020-01-18 DIAGNOSIS — F0391 Unspecified dementia with behavioral disturbance: Secondary | ICD-10-CM

## 2020-01-18 NOTE — Progress Notes (Signed)
[facility] Consult Note Telephone: [phone]  Fax: [phone]  PATIENT NAME: Toni Hayes DOB: 1924-10-25 MRN: [id number]  PRIMARY CARE PROVIDER:   Alvester Chou DNP REFERRING PROVIDER:  Deland Pretty, MD [address] [place],  [place] [address]  [place], [place] Eloy End, daughter [phone]  TELEHEALTH VISIT STATEMENT Due to the COVID-19 crisis, this visit was done via telephone from my office. It was initiated and consented to by this patient and/or family   RECOMMENDATIONS/PLAN: 1. Advance Care Planning/Goals of Care:Telehealth visit to build trust and follow up on patient as requested by family after elevated BP scare last week. Patient is a DNR and goals of care include tomaximize quality of life and symptom management. 2.Symptom management: BP back to baseline and no elevated readings since last week. BP today is 120/74. Patient denied headaches, pain/discomfort. [name] with no complaints; patient is pleasant and at her baseline. Encouraged caregiver to make sure patient taking her Metoprolol and Coreg as ordered; to be sure she swallows since patient sometimes drops medication on the floor. Patient's memory lossr/t Dementiaat baseline; FAST 6a; agitation well managed with Seroquel. Patriciacontinues to give9am dose in halves - in the morning and in the afternoon and then the 9pm dose in full. This has been effective. . Encouraged ongoing supportive care.  3. Follow up Palliative Care Visit: Palliative care will continue to follow for goals of care clarification and symptom management.  I spent79minutes providing this consultation; time includes chart review and documentation. More than 50% of the time in this consultation was spent on coordinating communication.  HISTORY OF PRESENT ILLNESS:Toni E Wylieis a 86 y.o.year oldfemalewith  multiple medical problems including Dementia, HTN, osteoporosis, OAPalliative Care was asked to help address goals of care.   CODE STATUS: DNR  PPS: 60% HOSPICE ELIGIBILITY/DIAGNOSIS: TBD  PAST MEDICAL HISTORY:  Past Medical History:  Diagnosis Date  . 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 B12 deficiency   . Back pain, thoracic    region  . Carpal tunnel syndrome, right   . Circulatory disease    Personal Hx of unspecified  . Coronary artery calcification seen on CAT scan   . Disorder of bone and cartilage   . Esophageal stricture   . Essential hypertension   . GERD (gastroesophageal reflux disease)   . Hemorrhage of rectum and anus   . Hx of colonic polyps   . Hyperlipidemia LDL goal <130   . Malignant neoplasm of uterus ([place])   . Sleep disorder   . Urinary frequency     SOCIAL HX:  Social History   Tobacco Use  . Smoking status: Former Smoker    Packs/day: 1.00    Years: 40.00    Pack years: 40.00    Quit date: 11/26/1972    Years since quitting: 47.1  . Smokeless tobacco: Never Used  . Tobacco comment: quit about age 56  Substance Use Topics  . Alcohol use: Yes    Alcohol/week: 1.0 standard drinks    Types: 1 Glasses of wine per week    ALLERGIES:  Allergies  Allergen Reactions  . Codeine     rash  . Meperidine Rash     PERTINENT MEDICATIONS:  Outpatient Encounter Medications as of 01/18/2020  Medication Sig  . acetaminophen (TYLENOL) 500 MG tablet Take 500 mg by mouth every 6 (six) hours as needed for mild pain.  . Calcium  Carbonate (CALCIUM 500 PO) Take 1,200 mg by mouth 2 (two) times daily.  . carvedilol (COREG) 3.125 MG tablet PLEASE SEE ATTACHED FOR DETAILED DIRECTIONS  . Cholecalciferol (VITAMIN D3) 2000 units TABS Take 1 tablet by mouth daily.  [name] Calcium (STOOL SOFTENER PO) Take by mouth as needed.  Marland Kitchen estradiol (ESTRACE) 2 MG tablet Take 2 mg by mouth as needed.   . hydrocortisone (ANUSOL-HC) 2.5 % rectal cream Place 1  application rectally 2 (two) times daily.  [name] Oil 500 MG CAPS Take 500 mg by mouth daily.  . metoprolol succinate (TOPROL-XL) 25 MG 24 hr tablet TAKE 1 TABLET BY MOUTH EVERY DAY  . Multiple Vitamin (MULTIVITAMIN) tablet Take 1 tablet by mouth daily.  . Probiotic Product (PROBIOTIC DAILY PO) Take by mouth daily.  . risedronate (ACTONEL) 35 MG tablet Take 35 mg by mouth once a week.  Marland Kitchen VITAMIN E PO Take 1 capsule by mouth daily.   No facility-administered encounter medications on file as of 01/18/2020.     Teodoro Spray, NP

## 2020-02-22 ENCOUNTER — Other Ambulatory Visit: Payer: Self-pay

## 2020-02-22 ENCOUNTER — Other Ambulatory Visit: Payer: Medicare HMO | Admitting: Hospice

## 2020-02-22 DIAGNOSIS — F0391 Unspecified dementia with behavioral disturbance: Secondary | ICD-10-CM

## 2020-02-22 DIAGNOSIS — Z515 Encounter for palliative care: Secondary | ICD-10-CM

## 2020-02-22 NOTE — Progress Notes (Signed)
[facility] Consult Note Telephone: [phone]  Fax: [phone]  PATIENT NAME: Toni Hayes DOB: 1924-04-01 MRN: [id number]  PRIMARY CARE PROVIDER:   Deland Pretty, MD  REFERRING PROVIDER:  Deland Pretty, MD Toni Hayes,  [place] [address]  [place], [place] Eloy End, daughter [phone]  TELEHEALTH VISIT STATEMENT Due to the COVID-19 crisis, this visit was done via telephone from my office. It was initiated and consented to by this patient and/or family   RECOMMENDATIONS/PLAN: 1. Advance Care Planning/Goals of Care:Telehealth visit with patient, Toni Hayes and Toni Hayes to build trust and follow up on palliative care. Patient is aDNR and goals of care include tomaximize quality of life and symptom management. 2.Symptom management: Toni Hayes with complaints of patient not cooperating as before, more agitation, not wanting to go to bed, getting up and waking Toni Hayes up with random talks, like going to school, taking communion. Sometimes patient not able to recognize family members. She refuses to take her pills and when she does, she is given half recommended dose 12.5mg  in the morning. No fevers, no urinary symptoms. Encouraged use of pudding/icecream to help her take seroquel 25mg  in the afternoon and 50mg  at night as ordered. If agitation not improved with taking her seroquel as ordered, may consider labs to rule out infection.  Toni Hayes to explore extra care options with patient's  LTC insurance and also make appointment with PCP. Patient is  FAST 6a. Patient denied headaches, pain/discomfort.  Encouraged caregiver to make sure patient taking her Metoprolol and Coreg as ordered. Encouraged ongoing supportive care. Recommended The 36 hour day book for [facility]. Called PCP and updated her with visit and recommendations. She is agreeable and expressed thanks.  3. Follow up  Palliative Care Visit: Palliative care will continue to follow for goals of care clarification and symptom management. I spentone hour and 16 minutesminutes providing this consultation; time includes chart review and documentation.More than 50% of the time in this consultation was spent on coordinating communication.  HISTORY OF PRESENT ILLNESS:Toni E Wylieis a 86 y.o.year oldfemalewith multiple medical problems including Dementia, HTN, osteoporosis, OAPalliative Care was asked to help address goals of care.   CODE STATUS: DNR  PPS: 60% HOSPICE ELIGIBILITY/DIAGNOSIS: TBD  PAST MEDICAL HISTORY:  Past Medical History:  Diagnosis Date  . 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 B12 deficiency   . Back pain, thoracic    region  . Carpal tunnel syndrome, right   . Circulatory disease    Personal Hx of unspecified  . Coronary artery calcification seen on CAT scan   . Disorder of bone and cartilage   . Esophageal stricture   . Essential hypertension   . GERD (gastroesophageal reflux disease)   . Hemorrhage of rectum and anus   . Hx of colonic polyps   . Hyperlipidemia LDL goal <130   . Malignant neoplasm of uterus ([place])   . Sleep disorder   . Urinary frequency     SOCIAL HX:  Social History   Tobacco Use  . Smoking status: Former Smoker    Packs/day: 1.00    Years: 40.00    Pack years: 40.00    Quit date: 11/26/1972    Years since quitting: 47.2  . Smokeless tobacco: Never Used  . Tobacco comment: quit about age 5  Substance Use Topics  . Alcohol use: Yes    Alcohol/week: 1.0 standard drinks  Types: 1 Glasses of wine per week    ALLERGIES:  Allergies  Allergen Reactions  . Codeine     rash  . Meperidine Rash     PERTINENT MEDICATIONS:  Outpatient Encounter Medications as of 02/22/2020  Medication Sig  . acetaminophen (TYLENOL) 500 MG tablet Take 500 mg by mouth every 6 (six) hours as needed for mild pain.  . Calcium Carbonate (CALCIUM  500 PO) Take 1,200 mg by mouth 2 (two) times daily.  . carvedilol (COREG) 3.125 MG tablet PLEASE SEE ATTACHED FOR DETAILED DIRECTIONS  . Cholecalciferol (VITAMIN D3) 2000 units TABS Take 1 tablet by mouth daily.  Toni Hayes Calcium (STOOL SOFTENER PO) Take by mouth as needed.  Marland Kitchen estradiol (ESTRACE) 2 MG tablet Take 2 mg by mouth as needed.   . hydrocortisone (ANUSOL-HC) 2.5 % rectal cream Place 1 application rectally 2 (two) times daily.  Toni Hayes Oil 500 MG CAPS Take 500 mg by mouth daily.  . metoprolol succinate (TOPROL-XL) 25 MG 24 hr tablet TAKE 1 TABLET BY MOUTH EVERY DAY  . Multiple Vitamin (MULTIVITAMIN) tablet Take 1 tablet by mouth daily.  . Probiotic Product (PROBIOTIC DAILY PO) Take by mouth daily.  . risedronate (ACTONEL) 35 MG tablet Take 35 mg by mouth once a week.  Marland Kitchen VITAMIN E PO Take 1 capsule by mouth daily.   No facility-administered encounter medications on file as of 02/22/2020.    Toni Spray, NP

## 2020-03-22 DIAGNOSIS — I10 Essential (primary) hypertension: Secondary | ICD-10-CM | POA: Diagnosis not present

## 2020-03-22 DIAGNOSIS — E118 Type 2 diabetes mellitus with unspecified complications: Secondary | ICD-10-CM | POA: Diagnosis not present

## 2020-03-22 DIAGNOSIS — R69 Illness, unspecified: Secondary | ICD-10-CM | POA: Diagnosis not present

## 2020-03-24 ENCOUNTER — Other Ambulatory Visit: Payer: Self-pay

## 2020-03-24 ENCOUNTER — Other Ambulatory Visit: Payer: Medicare HMO | Admitting: Hospice

## 2020-03-24 DIAGNOSIS — F0391 Unspecified dementia with behavioral disturbance: Secondary | ICD-10-CM

## 2020-03-24 DIAGNOSIS — Z515 Encounter for palliative care: Secondary | ICD-10-CM

## 2020-03-24 NOTE — Progress Notes (Signed)
[facility] Consult Note Telephone: [phone]  Fax: [phone]  PATIENT NAME: Toni Hayes DOB: [birth year] MRN: [id number]  PRIMARY CARE PROVIDER:   Deland Pretty, MD  REFERRING PROVIDER: REFERRING PROVIDER:  Deland Pretty, MD [address] [place],  [place] [address]  RESPONSIBLE PARTY:  RESPONSIBLE PARTY:Claire, daughter-in-lawand [place] Eloy End, daughter [phone]  TELEHEALTH VISIT STATEMENT Due to the COVID-19 crisis, this visit was done via telephone from my office. It was initiated and consented to by this patient and/or family.  RECOMMENDATIONS/PLAN:   Advance Care Planning/Goals of Care:  Visit consisted of building trust and follow up on palliative care. Goals of care include to maximize quality of life and symptom management. Patient is a DNR. Symptom management: Agitation has improved, well managed with Seroquel as currently ordered and administered. Patient seen by PCP 03/22/2020; no changes in plan of care. Patient has good appetite; she is pleasant and cooperative, helps out to do dishes and mow small portion of lawn under supervision.  Memory loss and confusion r/t Dementia at baseline.  FAST 6a. Patient denied headaches, pain/discomfort.  Pat with no complaints or concerns. Toni Hayes will share her insight into the  36 hour day book.   Encouraged ongoing supportive care.   Follow up Palliative Care Visit: Palliative care will continue to follow for goals of care clarification and symptom management. I spent48 minutesminutes providing this consultation; time includes chart review and documentation.More than 50% of the time in this consultation was spent on coordinating communication. HISTORY OF PRESENT ILLNESS:Toni E Wylieis a 86 y.o.year oldfemalewith multiple medical problems including Dementia, HTN, osteoporosis, OAPalliative Care was asked to help address goals of care.Marland Kitchen    CODE STATUS: DNR  PPS: 60% HOSPICE ELIGIBILITY/DIAGNOSIS: TBD  PAST MEDICAL HISTORY:  Past Medical History:  Diagnosis Date  . 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 B12 deficiency   . Back pain, thoracic    region  . Carpal tunnel syndrome, right   . Circulatory disease    Personal Hx of unspecified  . Coronary artery calcification seen on CAT scan   . Disorder of bone and cartilage   . Esophageal stricture   . Essential hypertension   . GERD (gastroesophageal reflux disease)   . Hemorrhage of rectum and anus   . Hx of colonic polyps   . Hyperlipidemia LDL goal <130   . Malignant neoplasm of uterus ([place])   . Sleep disorder   . Urinary frequency     SOCIAL HX:  Social History   Tobacco Use  . Smoking status: Former Smoker    Packs/day: 1.00    Years: 40.00    Pack years: 40.00    Quit date: 11/26/1972    Years since quitting: 47.3  . Smokeless tobacco: Never Used  . Tobacco comment: quit about age 50  Substance Use Topics  . Alcohol use: Yes    Alcohol/week: 1.0 standard drinks    Types: 1 Glasses of wine per week    ALLERGIES:  Allergies  Allergen Reactions  . Codeine     rash  . Meperidine Rash     PERTINENT MEDICATIONS:  Outpatient Encounter Medications as of 03/24/2020  Medication Sig  . acetaminophen (TYLENOL) 500 MG tablet Take 500 mg by mouth every 6 (six) hours as needed for mild pain.  . Calcium Carbonate (CALCIUM 500 PO) Take 1,200 mg by mouth 2 (two) times daily.  Marland Kitchen  carvedilol (COREG) 3.125 MG tablet PLEASE SEE ATTACHED FOR DETAILED DIRECTIONS  . Cholecalciferol (VITAMIN D3) 2000 units TABS Take 1 tablet by mouth daily.  [name] Calcium (STOOL SOFTENER PO) Take by mouth as needed.  Marland Kitchen estradiol (ESTRACE) 2 MG tablet Take 2 mg by mouth as needed.   . hydrocortisone (ANUSOL-HC) 2.5 % rectal cream Place 1 application rectally 2 (two) times daily.  [name] Oil 500 MG CAPS Take 500 mg by mouth daily.  . metoprolol succinate  (TOPROL-XL) 25 MG 24 hr tablet TAKE 1 TABLET BY MOUTH EVERY DAY  . Multiple Vitamin (MULTIVITAMIN) tablet Take 1 tablet by mouth daily.  . Probiotic Product (PROBIOTIC DAILY PO) Take by mouth daily.  . risedronate (ACTONEL) 35 MG tablet Take 35 mg by mouth once a week.  Marland Kitchen VITAMIN E PO Take 1 capsule by mouth daily.   No facility-administered encounter medications on file as of 03/24/2020.    Teodoro Spray, NP

## 2020-04-04 ENCOUNTER — Other Ambulatory Visit: Payer: Medicare HMO | Admitting: Hospice

## 2020-04-04 ENCOUNTER — Other Ambulatory Visit: Payer: Self-pay

## 2020-04-04 DIAGNOSIS — F0391 Unspecified dementia with behavioral disturbance: Secondary | ICD-10-CM

## 2020-04-04 DIAGNOSIS — Z515 Encounter for palliative care: Secondary | ICD-10-CM

## 2020-04-04 NOTE — Progress Notes (Signed)
[facility] Consult Note Telephone: [phone]  Fax: [phone]  PATIENT NAME: Toni Hayes DOB: [birth year] MRN: [id number]  PRIMARY CARE PROVIDER:   Deland Pretty, MD  REFERRING PROVIDER: Deland Pretty, MD  RESPONSIBLE PARTY:  RESPONSIBLE PARTY:Toni Hayes, daughter-in-lawand HCPOA [phone] Toni Hayes, daughter [phone]    RECOMMENDATIONS/PLAN:   Advance Care Planning/Goals of Care:  Visit consisted of building trust and follow up on palliative care.   HC POA and Toni Hayes present with patient.  Goals of care include to maximize quality of life and symptom management. Patient is a DNR.  MOST form selections include limited additional intervention, determine use of antibiotics, IV fluids for defined trial,  No feeding tube. Symptom management: Agitation related to dementia is well managed with Seroquel as currently ordered and administered - 25mg  at 1pm and 50mg  at night.Patient seen by PCP 03/22/2020; no changes in plan of care. Patient has good appetite; she is pleasant and cooperative, helps out to do dishes.  She enjoys to do puzzles and knitting.  Memory loss and confusion r/t Dementia is ongoing. FAST 6a. Patient denied headaches, pain/discomfort; no coughing no shortness of breath.  Patient is ambulatory; no report of recent fall.  Patient continues on metoprolol for hypertension. Toni Hayes has started draining the 36-hour day book and said it is helpful with useful information. Toni Hayes reported patient is sleeping more during the day and staying up at night sometimes.   Education provided on the need to encourage engaging activities during the day and maintaining night routines and bedtime regimen. Toni Hayes reported that when patient is awake during the day and by herself, she feels it is not safe especially when Toni Hayes is busy doing other things around the house.  She is only able to get things done during the day when patient is either  with her or is sleeping. Discussion on getting additional help for patient during the day. [facility] POA mentioned that patient has an Set designer that allows home services and would explore options such as a family friend - Quarry manager, Database administrator and Adult careservices - [facility] .  NP would ask [facility] Education officer, museum for more ideas.  Patient in no acute distress.  Encouraged ongoing care.  Follow up Palliative Care Visit: Palliative care will continue to follow for goals of care clarification and symptom management. I spentone hour and 33minutes providing this consultation; time includes chart review and documentation.More than 50% of the time in this consultation was spent on coordinating communication. HISTORY OF PRESENT ILLNESS:Toni E Wylieis a 86 y.o.year oldfemalewith multiple medical problems including Dementia, HTN, osteoporosis, OAPalliative Care was asked to help address goals of care.Marland Kitchen     CODE STATUS: DNR  PPS: 60% HOSPICE ELIGIBILITY/DIAGNOSIS: TBD  PAST MEDICAL HISTORY:  Past Medical History:  Diagnosis Date  . 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 B12 deficiency   . Back pain, thoracic    region  . Carpal tunnel syndrome, right   . Circulatory disease    Personal Hx of unspecified  . Coronary artery calcification seen on CAT scan   . Disorder of bone and cartilage   . Esophageal stricture   . Essential hypertension   . GERD (gastroesophageal reflux disease)   . Hemorrhage of rectum and anus   . Hx of colonic polyps   . Hyperlipidemia LDL goal <130   . Malignant neoplasm of uterus ([place])   . Sleep disorder   . Urinary  frequency     SOCIAL HX:  Social History   Tobacco Use  . Smoking status: Former Smoker    Packs/day: 1.00    Years: 40.00    Pack years: 40.00    Quit date: 11/26/1972    Years since quitting: 47.3  . Smokeless tobacco: Never Used  . Tobacco comment: quit about age 30  Substance Use Topics  . Alcohol use: Yes     Alcohol/week: 1.0 standard drinks    Types: 1 Glasses of wine per week    ALLERGIES:  Allergies  Allergen Reactions  . Codeine     rash  . Meperidine Rash     PERTINENT MEDICATIONS:  Outpatient Encounter Medications as of 04/04/2020  Medication Sig  . acetaminophen (TYLENOL) 500 MG tablet Take 500 mg by mouth every 6 (six) hours as needed for mild pain.  . Calcium Carbonate (CALCIUM 500 PO) Take 1,200 mg by mouth 2 (two) times daily.  . carvedilol (COREG) 3.125 MG tablet PLEASE SEE ATTACHED FOR DETAILED DIRECTIONS  . Cholecalciferol (VITAMIN D3) 2000 units TABS Take 1 tablet by mouth daily.  [name] Calcium (STOOL SOFTENER PO) Take by mouth as needed.  Marland Kitchen estradiol (ESTRACE) 2 MG tablet Take 2 mg by mouth as needed.   . hydrocortisone (ANUSOL-HC) 2.5 % rectal cream Place 1 application rectally 2 (two) times daily.  [name] Oil 500 MG CAPS Take 500 mg by mouth daily.  . metoprolol succinate (TOPROL-XL) 25 MG 24 hr tablet TAKE 1 TABLET BY MOUTH EVERY DAY  . Multiple Vitamin (MULTIVITAMIN) tablet Take 1 tablet by mouth daily.  . Probiotic Product (PROBIOTIC DAILY PO) Take by mouth daily.  . risedronate (ACTONEL) 35 MG tablet Take 35 mg by mouth once a week.  Marland Kitchen VITAMIN E PO Take 1 capsule by mouth daily.   No facility-administered encounter medications on file as of 04/04/2020.    PHYSICAL EXAM/ROS:  BP 136/72 P83 R18 P82  General: NAD,cooperative Cardiovascular: regular rate and rhythm, denies pain/discomfort Pulmonary: clear ant fields; normal respiratory effort, no coughing no shortness of breath Abdomen: soft, nontender, + bowel sounds GU: no suprapubic tenderness Extremities: no edema, no joint deformities Skin: no rashes on exposed skin Neurological: Weakness but otherwise nonfocal  Toni Spray, NP

## 2020-04-19 DIAGNOSIS — R69 Illness, unspecified: Secondary | ICD-10-CM | POA: Diagnosis not present

## 2020-04-19 DIAGNOSIS — I10 Essential (primary) hypertension: Secondary | ICD-10-CM | POA: Diagnosis not present

## 2020-04-19 DIAGNOSIS — R002 Palpitations: Secondary | ICD-10-CM | POA: Diagnosis not present

## 2020-04-19 DIAGNOSIS — E118 Type 2 diabetes mellitus with unspecified complications: Secondary | ICD-10-CM | POA: Diagnosis not present

## 2020-05-11 ENCOUNTER — Telehealth: Payer: Self-pay

## 2020-05-11 NOTE — Telephone Encounter (Signed)
Family returned call. Palliative home appt scheduled for 7/15 at 2 pm

## 2020-05-24 DIAGNOSIS — I10 Essential (primary) hypertension: Secondary | ICD-10-CM | POA: Diagnosis not present

## 2020-05-24 DIAGNOSIS — R69 Illness, unspecified: Secondary | ICD-10-CM | POA: Diagnosis not present

## 2020-05-24 DIAGNOSIS — I482 Chronic atrial fibrillation, unspecified: Secondary | ICD-10-CM | POA: Diagnosis not present

## 2020-05-24 DIAGNOSIS — R002 Palpitations: Secondary | ICD-10-CM | POA: Diagnosis not present

## 2020-06-09 ENCOUNTER — Other Ambulatory Visit: Payer: Self-pay

## 2020-06-09 ENCOUNTER — Other Ambulatory Visit: Payer: Medicare HMO | Admitting: Hospice

## 2020-06-09 DIAGNOSIS — Z515 Encounter for palliative care: Secondary | ICD-10-CM

## 2020-06-09 DIAGNOSIS — F0391 Unspecified dementia with behavioral disturbance: Secondary | ICD-10-CM

## 2020-06-09 NOTE — Progress Notes (Addendum)
Toni Hayes Consult Note Telephone: [phone]  Fax: [phone]  PATIENT NAME: Toni Hayes DOB: [birth year] MRN: [id number]  PRIMARY CARE PROVIDER:   Deland Pretty, MD  REFERRING PROVIDER: Deland Pretty, MD  RESPONSIBLE PARTY:RESPONSIBLE PARTY:Claire, daughter-in-lawand [facility] [phone] [place] End, daughter [phone]  TELEHEALTH VISIT STATEMENT Due to the COVID-19 crisis, this visit was done via telephone from my office. It was initiated and consented to by this patient and/or family.     RECOMMENDATIONS/PLAN:  Advance Care Planning/Goals of Care: Visit consisted of building trust and follow up on palliative care.  HC POA/spouse and [name] present with patient.  Goals of care include to maximize quality of life and symptom management.Patient is a DNR.  MOST form selections include limited additional intervention, determine use of antibiotics, IV fluids for defined trial,  No feeding tube. Symptom management:Ongoing confusion and memory loss related to dementia disease.  FAST 6A. Agitation related to dementia is well managed with Seroquel as currently ordered and administered - 25mg  at 1pm and 50mg  at night.last month patient was treated for UTI and hemorrhoids.  UTI resolved.  Preparation hemorrhage is in use for ongoing management of hemorrhoids.  Patient continues to have good appetite; she is pleasant and cooperative, helps out to do dishes.  She enjoys to do puzzles and knitting. Memory loss and confusion r/t Dementia is ongoing. FAST 6a. Patient denied headaches, pain/discomfort; no coughing no shortness of breath.  Patient is ambulatory; no report of recent fall.   Follow-up discussion on getting additional help for patient during the day, [facility] POA reported he needs attestation of patient's dementia from 2 providers to help process her insurance can pay for home health services.  PCP has provided letter  of attestation; requesting this NP to write the second letter of attestation during next in person visit 07/11/2020 or before.  Patient in no acute distress; family with no concerns at this time.  Encouraged ongoing care. Follow up Palliative Care Visit: Palliative care will continue to follow for goals of care clarification and symptom management. I spent59minutes providing this consultation; time includes chart review and documentation.More than 50% of the time in this consultation was spent on coordinating communication. 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   B12 deficiency    Back pain, thoracic    region   Carpal tunnel syndrome, right    Circulatory disease    Personal Hx of unspecified   Coronary artery calcification seen on CAT scan    Disorder of bone and cartilage    Esophageal stricture    Essential hypertension    GERD (gastroesophageal reflux disease)    Hemorrhage of rectum and anus    Hx of colonic polyps    Hyperlipidemia LDL goal <130    Malignant neoplasm of uterus ([facility])    Sleep disorder    Urinary frequency     SOCIAL HX:  Social History   Tobacco Use   Smoking status: Former Smoker    Packs/day: 1.00    Years: 40.00    Pack years: 40.00    Quit date: 11/26/1972    Years since quitting: 67.5  Smokeless tobacco: Never Used   Tobacco comment: quit about age 67  Substance Use Topics   Alcohol use: Yes    Alcohol/week: 1.0 standard drink    Types: 1 Glasses of wine per week    ALLERGIES:  Allergies  Allergen Reactions   Codeine     rash   Meperidine Rash     PERTINENT  MEDICATIONS:  Outpatient Encounter Medications as of 06/09/2020  Medication Sig   acetaminophen (TYLENOL) 500 MG tablet Take 500 mg by mouth every 6 (six) hours as needed for mild pain.   Calcium Carbonate (CALCIUM 500 PO) Take 1,200 mg by mouth 2 (two) times daily.   carvedilol (COREG) 3.125 MG tablet PLEASE SEE ATTACHED FOR DETAILED DIRECTIONS   Cholecalciferol (VITAMIN D3) 2000 units TABS Take 1 tablet by mouth daily.   Docusate Calcium (STOOL SOFTENER PO) Take by mouth as needed.   estradiol (ESTRACE) 2 MG tablet Take 2 mg by mouth as needed.    hydrocortisone (ANUSOL-HC) 2.5 % rectal cream Place 1 application rectally 2 (two) times daily.   Krill Oil 500 MG CAPS Take 500 mg by mouth daily.   metoprolol succinate (TOPROL-XL) 25 MG 24 hr tablet TAKE 1 TABLET BY MOUTH EVERY DAY   Multiple Vitamin (MULTIVITAMIN) tablet Take 1 tablet by mouth daily.   Probiotic Product (PROBIOTIC DAILY PO) Take by mouth daily.   risedronate (ACTONEL) 35 MG tablet Take 35 mg by mouth once a week.   VITAMIN E PO Take 1 capsule by mouth daily.   No facility-administered encounter medications on file as of 06/09/2020.     Teodoro Spray, NP

## 2020-06-21 DIAGNOSIS — I10 Essential (primary) hypertension: Secondary | ICD-10-CM | POA: Diagnosis not present

## 2020-06-21 DIAGNOSIS — R69 Illness, unspecified: Secondary | ICD-10-CM | POA: Diagnosis not present

## 2020-06-21 DIAGNOSIS — I482 Chronic atrial fibrillation, unspecified: Secondary | ICD-10-CM | POA: Diagnosis not present

## 2020-06-21 DIAGNOSIS — R002 Palpitations: Secondary | ICD-10-CM | POA: Diagnosis not present

## 2020-07-11 ENCOUNTER — Other Ambulatory Visit: Payer: Medicare HMO | Admitting: Hospice

## 2020-07-11 ENCOUNTER — Other Ambulatory Visit: Payer: Self-pay

## 2020-07-11 DIAGNOSIS — F0391 Unspecified dementia with behavioral disturbance: Secondary | ICD-10-CM

## 2020-07-11 DIAGNOSIS — R69 Illness, unspecified: Secondary | ICD-10-CM | POA: Diagnosis not present

## 2020-07-11 DIAGNOSIS — Z515 Encounter for palliative care: Secondary | ICD-10-CM | POA: Diagnosis not present

## 2020-07-11 NOTE — Progress Notes (Signed)
[facility] Consult Note Telephone: [phone]  Fax: [phone] PATIENT NAME:Toni Hayes DOB:January 01, 1924 TML:[id number]  PRIMARY CARE PROVIDER:Pharr, Thayer Jew, MD  REFERRING PROVIDER:Pharr, Thayer Jew, MD  RESPONSIBLE PARTY:RESPONSIBLE PARTY:Toni Hayes, daughter-in-lawand HCPOA [phone] Toni Hayes, daughter [phone] -caregiver    RECOMMENDATIONS/PLAN:  Advance Care Planning/Goals of Care: Visit consisted of building trust and follow up on palliative care.HCPOA/spouse and Toni Hayes present with patient.  Goals of care:Goals of care include to maximize quality of life and symptom management.  MOST form selections include limited additional intervention, determine use of antibiotics, IV fluids for defined trial, No feeding tube.  Family is open to hospice services when patient qualifies for it. CODE STATUS:Patient is a DO NOT RESUSCITATE. Follow up Palliative Care Visit: Palliative care will continue to follow for goals of care clarification and symptom management. Next visit in 2 months. Symptom management:Patient saw PCP 06/21/2020 with no acute findings.  Agitation currently managed with Seroquel 50 mg in the afternoon and 50 mg at night.  Caregiver reports that sometimes patient mixes up her days with her nights and tries to become active and awake during the night.  Over-the-counter melatonin 3 mg at bedtime recommended.  Ongoing confusion and memory loss related to dementia disease, at baseline.  FAST 6A.  Patient endorsed mild to moderate pain in left lower back.  She denies difficulty breathing; no coughing, no shortness of breath.  Postural awareness discussed.  Biofreeze recommended; Tylenol 500 mg 3 times daily as needed pain; heating pad as needed as an option.  Caregiver endorsed patient has gained weight, approximately 7 pounds since last visit, related to increased intake of cookies, ice cream and  treats.  Education provided on relationship between increasing sweets/weight and blood sugar levels//diabetes.  NP encouraged ration control, increased activity/exercises as tolerated and continuing with wholesome diet rich in fruits and vegetables.  Family is agreeable.  Patient no longer on preparation H; hemorrhoids resolved at this time. She continues to enjoy  puzzles and knitting.  Encouraged ongoing care and current strong family support system. I spent1 hour and 16 minutes providing this consultation; time includes time spent with patient/family, chart review and documentation.More than 50% of the time in this consultation was spent on coordinating communication. HISTORY OF PRESENT ILLNESS:Toni E Wylieis a 86 y.o.year oldfemalewith multiple medical problems including Dementia, HTN, osteoporosis, OAPalliative Care was asked to help address goals of care.Marland Kitchen   CODE STATUS:DNR  PPS:60% HOSPICE ELIGIBILITY/DIAGNOSIS: TBD  PAST MEDICAL HISTORY:  Past Medical History:  Diagnosis Date  . 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 B12 deficiency   . Back pain, thoracic    region  . Carpal tunnel syndrome, right   . Circulatory disease    Personal Hx of unspecified  . Coronary artery calcification seen on CAT scan   . Disorder of bone and cartilage   . Esophageal stricture   . Essential hypertension   . GERD (gastroesophageal reflux disease)   . Hemorrhage of rectum and anus   . Hx of colonic polyps   . Hyperlipidemia LDL goal <130   . Malignant neoplasm of uterus (Toni Hayes)   . Sleep disorder   . Urinary frequency     SOCIAL HX:  Social History   Tobacco Use  . Smoking status: Former Smoker    Packs/day: 1.00    Years: 40.00    Pack years: 40.00    Quit date: 11/26/1972    Years since quitting:  47.6  . Smokeless tobacco: Never Used  . Tobacco comment: quit about age 40  Substance Use Topics  . Alcohol use: Yes    Alcohol/week: 1.0 standard drink     Types: 1 Glasses of wine per week    ALLERGIES:  Allergies  Allergen Reactions  . Codeine     rash  . Meperidine Rash     PERTINENT MEDICATIONS:  Outpatient Encounter Medications as of 07/11/2020  Medication Sig  . acetaminophen (TYLENOL) 500 MG tablet Take 500 mg by mouth every 6 (six) hours as needed for mild pain.  . Calcium Carbonate (CALCIUM 500 PO) Take 1,200 mg by mouth 2 (two) times daily.  . carvedilol (COREG) 3.125 MG tablet PLEASE SEE ATTACHED FOR DETAILED DIRECTIONS  . Cholecalciferol (VITAMIN D3) 2000 units TABS Take 1 tablet by mouth daily.  [name] Calcium (STOOL SOFTENER PO) Take by mouth as needed.  Marland Kitchen estradiol (ESTRACE) 2 MG tablet Take 2 mg by mouth as needed.   . hydrocortisone (ANUSOL-HC) 2.5 % rectal cream Place 1 application rectally 2 (two) times daily.  [name] Oil 500 MG CAPS Take 500 mg by mouth daily.  . metoprolol succinate (TOPROL-XL) 25 MG 24 hr tablet TAKE 1 TABLET BY MOUTH EVERY DAY  . Multiple Vitamin (MULTIVITAMIN) tablet Take 1 tablet by mouth daily.  . Probiotic Product (PROBIOTIC DAILY PO) Take by mouth daily.  . risedronate (ACTONEL) 35 MG tablet Take 35 mg by mouth once a week.  Marland Kitchen VITAMIN E PO Take 1 capsule by mouth daily.   No facility-administered encounter medications on file as of 07/11/2020.    PHYSICAL EXAM/ROS:  General: NAD, cooperative Cardiovascular: regular rate and rhythm; denies chest pain Pulmonary: clear ant//post fields Abdomen: soft, nontender, + bowel sounds GU: no suprapubic tenderness Extremities: no edema to bilateral lower extremities Skin: no rashes to exposed skin Neurological: Weakness but otherwise nonfocal  Teodoro Spray, NP

## 2020-07-19 DIAGNOSIS — I4891 Unspecified atrial fibrillation: Secondary | ICD-10-CM | POA: Diagnosis not present

## 2020-07-19 DIAGNOSIS — R002 Palpitations: Secondary | ICD-10-CM | POA: Diagnosis not present

## 2020-07-19 DIAGNOSIS — I482 Chronic atrial fibrillation, unspecified: Secondary | ICD-10-CM | POA: Diagnosis not present

## 2020-07-19 DIAGNOSIS — I8391 Asymptomatic varicose veins of right lower extremity: Secondary | ICD-10-CM | POA: Diagnosis not present

## 2020-07-19 DIAGNOSIS — I10 Essential (primary) hypertension: Secondary | ICD-10-CM | POA: Diagnosis not present

## 2020-07-19 DIAGNOSIS — R69 Illness, unspecified: Secondary | ICD-10-CM | POA: Diagnosis not present

## 2020-08-23 DIAGNOSIS — R69 Illness, unspecified: Secondary | ICD-10-CM | POA: Diagnosis not present

## 2020-08-23 DIAGNOSIS — R002 Palpitations: Secondary | ICD-10-CM | POA: Diagnosis not present

## 2020-08-23 DIAGNOSIS — I482 Chronic atrial fibrillation, unspecified: Secondary | ICD-10-CM | POA: Diagnosis not present

## 2020-08-23 DIAGNOSIS — I10 Essential (primary) hypertension: Secondary | ICD-10-CM | POA: Diagnosis not present

## 2020-09-05 ENCOUNTER — Other Ambulatory Visit: Payer: Medicare HMO | Admitting: Hospice

## 2020-09-05 ENCOUNTER — Other Ambulatory Visit: Payer: Self-pay

## 2020-09-05 DIAGNOSIS — F0391 Unspecified dementia with behavioral disturbance: Secondary | ICD-10-CM

## 2020-09-05 DIAGNOSIS — Z515 Encounter for palliative care: Secondary | ICD-10-CM

## 2020-09-05 NOTE — Progress Notes (Signed)
[facility] Consult Note Telephone: [phone]  Fax: [phone]  PATIENT NAME: Toni Hayes DOB: 04-19-1924 MRN: [id number]  PRIMARY CARE PROVIDER:   Deland Pretty, MD Toni Hayes, [address] [place],  [place] [address]  REFERRING PROVIDER: Alvester Chou DNP  REFERRING PROVIDER:Pharr, Thayer Jew, MD  RESPONSIBLE PARTY:RESPONSIBLE Toni Hayes, Toni Hayes-in-lawand HCPOA [phone] Toni Hayes, Toni Hayes [phone] -caregiver   TELEHEALTH VISIT STATEMENT Due to the COVID-19 crisis, this visit was done via telephone from my office. It was initiated and consented to by this patient and/or family.  RECOMMENDATIONS/PLAN:  Advance Care Planning/Goals of Care: Visit consisted of building trust and discussions on Palliative Medicine as specialized medical care for people living with serious illness, aimed at facilitating better quality of life through symptoms relief, assisting with advance care plan and establishing goals of care.   Goals of care:Goals of care include to maximize quality of life and symptom management.  MOST form selections include limited additional intervention, determine use of antibiotics, IV fluids for defined trial, No feeding tube.  Family is open to hospice services when patient qualifies for it.  CODE STATUS:Patient is a DO NOT RESUSCITATE.  Follow up Palliative Care Visit: Palliative care will continue to follow for goals of care clarification and symptom management. Next visit in 2 months.  Symptom management: Agitation: PCP increased Seroquel dose to 100mg  in the evening and 50mg  in the morning; patient may use 25 mg in the afternoon if needed.This is effective- patient calm during the day and sleeping better at night. Patient continues to be a high fall risk; last fall last week in which she hit her head; no ER evaluation in line with  goals of care.    Encouraged use  of rolling walker for supports and to help prevent fall.  Ongoing confusion and memory loss related to dementia disease, at baseline.FAST 6A.  No report of pain or change in behavior. Encouraged ongoing care.  Appetite remains good; patient enjoys food.  Family continues to provide her with nutritious balanced diet.  Palliative will continue to monitor for symptom management/decline and make recommendations as needed.  07/11/2020: Patient endorsed mild to moderate pain in left lower back.  She denies difficulty breathing; no coughing, no shortness of breath.  Postural awareness discussed.  Biofreeze recommended; Tylenol 500 mg 3 times daily as needed pain; heating pad as needed as an option.  Appetite is a good; she enjoys food.   Patient no longer on preparation H; hemorrhoids resolved at this time. She continues to enjoy  puzzles and knitting.   Family/caregiver/community supports: Patient lives at home with her Toni Hayes Toni Hayes who is her primary caregiver.  Has son and Toni Hayes in law Toni Hayes are also involved in her care. Strong family support system.   I spent48 minutes providing this consultation; time includes time spent with patient/family, chart review and documentation.More than 50% of the time in this consultation was spent on coordinating communication.   HISTORY OF PRESENT ILLNESS:Toni E Wylieis a 86 y.o.year oldfemalewith multiple medical problems including Dementia, HTN, osteoporosis, OAPalliative Care was asked to help address goals of care.Marland Kitchen   CODE STATUS:DNR  PPS:60%  HOSPICE ELIGIBILITY/DIAGNOSIS: TBD  PAST MEDICAL HISTORY:  Past Medical History:  Diagnosis Date  . 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 B12 deficiency   . Back pain, thoracic    region  . Carpal tunnel syndrome, right   . Circulatory disease  Personal Hx of unspecified  . Coronary artery calcification seen on CAT scan   . Disorder of bone and cartilage   .  Esophageal stricture   . Essential hypertension   . GERD (gastroesophageal reflux disease)   . Hemorrhage of rectum and anus   . Hx of colonic polyps   . Hyperlipidemia LDL goal <130   . Malignant neoplasm of uterus ([place])   . Sleep disorder   . Urinary frequency     SOCIAL HX:  Social History   Tobacco Use  . Smoking status: Former Smoker    Packs/day: 1.00    Years: 40.00    Pack years: 40.00    Quit date: 11/26/1972    Years since quitting: 47.8  . Smokeless tobacco: Never Used  . Tobacco comment: quit about age 89  Substance Use Topics  . Alcohol use: Yes    Alcohol/week: 1.0 standard drink    Types: 1 Glasses of wine per week    ALLERGIES:  Allergies  Allergen Reactions  . Codeine     rash  . Meperidine Rash     PERTINENT MEDICATIONS:  Outpatient Encounter Medications as of 09/05/2020  Medication Sig  . acetaminophen (TYLENOL) 500 MG tablet Take 500 mg by mouth every 6 (six) hours as needed for mild pain.  . Calcium Carbonate (CALCIUM 500 PO) Take 1,200 mg by mouth 2 (two) times daily.  . carvedilol (COREG) 3.125 MG tablet PLEASE SEE ATTACHED FOR DETAILED DIRECTIONS  . Cholecalciferol (VITAMIN D3) 2000 units TABS Take 1 tablet by mouth daily.  Toni Hayes Calcium (STOOL SOFTENER PO) Take by mouth as needed.  Marland Kitchen estradiol (ESTRACE) 2 MG tablet Take 2 mg by mouth as needed.   . hydrocortisone (ANUSOL-HC) 2.5 % rectal cream Place 1 application rectally 2 (two) times daily.  Toni Hayes Oil 500 MG CAPS Take 500 mg by mouth daily.  . metoprolol succinate (TOPROL-XL) 25 MG 24 hr tablet TAKE 1 TABLET BY MOUTH EVERY DAY  . Multiple Vitamin (MULTIVITAMIN) tablet Take 1 tablet by mouth daily.  . Probiotic Product (PROBIOTIC DAILY PO) Take by mouth daily.  . risedronate (ACTONEL) 35 MG tablet Take 35 mg by mouth once a week.  Marland Kitchen VITAMIN E PO Take 1 capsule by mouth daily.   No facility-administered encounter medications on file as of 09/05/2020.    Toni Spray, NP

## 2020-09-14 DIAGNOSIS — R69 Illness, unspecified: Secondary | ICD-10-CM | POA: Diagnosis not present

## 2020-10-24 DIAGNOSIS — R002 Palpitations: Secondary | ICD-10-CM | POA: Diagnosis not present

## 2020-10-24 DIAGNOSIS — F329 Major depressive disorder, single episode, unspecified: Secondary | ICD-10-CM | POA: Diagnosis not present

## 2020-10-24 DIAGNOSIS — I482 Chronic atrial fibrillation, unspecified: Secondary | ICD-10-CM | POA: Diagnosis not present

## 2020-10-24 DIAGNOSIS — R69 Illness, unspecified: Secondary | ICD-10-CM | POA: Diagnosis not present

## 2020-10-25 ENCOUNTER — Telehealth: Payer: Self-pay

## 2020-10-25 NOTE — Telephone Encounter (Signed)
Received phone call from PCP, Alvester Chou NP, who requested that Palliative care SW reach out to patient's daughter in law for discussion regarding possible placement in memory care. Update provided

## 2020-10-26 ENCOUNTER — Telehealth: Payer: Self-pay

## 2020-10-26 NOTE — Telephone Encounter (Signed)
(  1:40p) Palliative care SW completed a follow-up call to patient's daughter-in-law-Claire to provide support and education regarding long-term care placement. SW discussed at length the memory care level of care, provided a list of facilities to research and visit and provided normalization and validation of feelings of the difficulty and stress that placing a loved one can cause. SW provided reassurance of support and encouraged [name] to call with any additional questions or concerns.

## 2021-01-19 DIAGNOSIS — F329 Major depressive disorder, single episode, unspecified: Secondary | ICD-10-CM | POA: Diagnosis not present

## 2021-01-19 DIAGNOSIS — I482 Chronic atrial fibrillation, unspecified: Secondary | ICD-10-CM | POA: Diagnosis not present

## 2021-01-19 DIAGNOSIS — R69 Illness, unspecified: Secondary | ICD-10-CM | POA: Diagnosis not present

## 2021-01-19 DIAGNOSIS — R002 Palpitations: Secondary | ICD-10-CM | POA: Diagnosis not present

## 2021-02-23 ENCOUNTER — Telehealth: Payer: Self-pay

## 2021-02-23 DIAGNOSIS — I482 Chronic atrial fibrillation, unspecified: Secondary | ICD-10-CM | POA: Diagnosis not present

## 2021-02-23 DIAGNOSIS — R002 Palpitations: Secondary | ICD-10-CM | POA: Diagnosis not present

## 2021-02-23 DIAGNOSIS — R69 Illness, unspecified: Secondary | ICD-10-CM | POA: Diagnosis not present

## 2021-02-23 DIAGNOSIS — B372 Candidiasis of skin and nail: Secondary | ICD-10-CM | POA: Diagnosis not present

## 2021-02-23 NOTE — Telephone Encounter (Signed)
Received phone call from Alvester Chou NP that requested that Palliative Care follow more closely. Patient has displayed decline. Patient is falling more frequently and showing signs of dysphagia. Palliative NP updated.

## 2021-02-27 ENCOUNTER — Other Ambulatory Visit: Payer: Medicare HMO | Admitting: Hospice

## 2021-02-27 ENCOUNTER — Other Ambulatory Visit: Payer: Self-pay

## 2021-02-27 DIAGNOSIS — R269 Unspecified abnormalities of gait and mobility: Secondary | ICD-10-CM | POA: Diagnosis not present

## 2021-02-27 DIAGNOSIS — Z515 Encounter for palliative care: Secondary | ICD-10-CM | POA: Diagnosis not present

## 2021-02-27 NOTE — Progress Notes (Signed)
[facility] Consult Note Telephone: [phone]  Fax: [phone]  PATIENT NAME: Toni Hayes DOB: October 09, 1924 MRN: [id number]   PRIMARY CARE PROVIDER: Alvester Chou DNP  REFERRING PROVIDER:Pharr, Thayer Jew, MD  RESPONSIBLE PARTY:RESPONSIBLE PARTY:Toni Hayes, daughter-in-lawand HCPOA [phone] Toni Hayes, daughter [phone]   TELEHEALTH VISIT STATEMENT Due to the COVID-19 crisis, this visit was done via telephone from my office. It was initiated and consented to by this patient and/or family.  Visit is to build trust and highlight Palliative Medicine as specialized medical care for people living with serious illness, aimed at facilitating better quality of life through symptoms relief, assisting with advance care plan and establishing goals of care.   CHIEF COMPLAINT: Palliative follow up visit/Gait disturbance  RECOMMENDATIONS/PLAN:   1. Advance Care Planning/Code Status:Patient is a Do not Resuscitate.   2. Goals of Care: Goals of care include to maximize quality of life and symptom management.  Visit consisted of counseling and education dealing with the complex and emotionally intense issues of symptom management and palliative care in the setting of serious and potentially life-threatening illness. Palliative care team will continue to support patient, patient's family, and medical team.  I spent 16 minutes providing this consultation. More than 50% of the time in this consultation was spent on coordinating communication.  -------------------------------------------------------------------------------------------------------------------------------------------------- 3. Symptom management/Plan:  Gait disturbance. Safety and Fall precautions discussed. Use of rolling walker and slow mobility transitions encouraged. Family not ready for PT/OT at this time; prefers in person visit to further evaluate.   Education on dementia as a progressive disease; gait disturbance as an unfolding symptom in the disease trajectory. Constipation: Miralax 17 gram mixed in 4-6 ounces of fluid daily, back off with diarrhea.  Agitation: Continue Seroquel 100mg TID.  Calm approach, distraction and de-escalation techniques. Palliative will continue to monitor for symptom management/decline and make recommendations as needed. Return in a month or prn. Encouraged to call provider sooner with any concerns.   HISTORY OF PRESENT ILLNESS:  Toni Hayes is a 87 y.o. female with multiple medical problems including gait disturbance likely related to worsening dementia, resulting in recurring falls. Gait disturbance has been going on for about 2 years worsening in the last 3 months impairing her ADLs and quality of life. Her daughter Toni Hayes reports patient refuses to use her rolling walker; she is not sure what helps.  History of hypertension, osteoporosis, osteoarthritis, dementia.  History obtained from review of EMR, discussion with primary team, and  interview with family, caregiver  and/or patient. Records reviewed and summarized above. All 10 point systems reviewed and are negative except as documented in history of present illness above  Review and summarization of Epic records shows history from other than patient.   Palliative Care was asked to follow this patient by consultation request of Toni Pretty, MD to help address complex decision making in the context of advance care planning and goals of care clarification.   CODE STATUS: DNR  PPS: 40%  HOSPICE ELIGIBILITY/DIAGNOSIS: TBD  PAST MEDICAL HISTORY:  Past Medical History:  Diagnosis Date  . 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 B12 deficiency   . Back pain, thoracic    region  . Carpal tunnel syndrome, right   . Circulatory disease    Personal Hx of unspecified  . Coronary artery calcification seen on CAT scan   . Disorder of bone and  cartilage   . Esophageal stricture   . Essential  hypertension   . GERD (gastroesophageal reflux disease)   . Hemorrhage of rectum and anus   . Hx of colonic polyps   . Hyperlipidemia LDL goal <130   . Malignant neoplasm of uterus ([place])   . Sleep disorder   . Urinary frequency      SOCIAL HX: @SOCX  Patient lives at home with her daughter Toni Hayes  for ongoing care   FAMILY HX:  Family History  Problem Relation Age of Onset  . Breast cancer Mother   . Stroke Father   . Diabetes Brother   . Heart attack Brother   . Lung cancer Brother   . Liver cancer Brother   . Colon cancer Son   . Colon polyps Neg Hx   . Rectal cancer Neg Hx   . Stomach cancer Neg Hx     Review lab tests/diagnostics No results for input(s): WBC, HGB, HCT, PLT, MCV in the last 168 hours. No results for input(s): NA, K, CL, CO2, BUN, CREATININE, GLUCOSE in the last 168 hours. Latest GFR by Cockcroft Gault (not valid in AKI or ESRD) CrCl cannot be calculated (Patient's most recent lab result is older than the maximum 21 days allowed.).  ALLERGIES:  Allergies  Allergen Reactions  . Codeine     rash  . Meperidine Rash      PERTINENT MEDICATIONS:  Outpatient Encounter Medications as of 02/27/2021  Medication Sig  . acetaminophen (TYLENOL) 500 MG tablet Take 500 mg by mouth every 6 (six) hours as needed for mild pain.  . Calcium Carbonate (CALCIUM 500 PO) Take 1,200 mg by mouth 2 (two) times daily.  . carvedilol (COREG) 3.125 MG tablet PLEASE SEE ATTACHED FOR DETAILED DIRECTIONS  . Cholecalciferol (VITAMIN D3) 2000 units TABS Take 1 tablet by mouth daily.  Toni Hayes Calcium (STOOL SOFTENER PO) Take by mouth as needed.  Marland Kitchen estradiol (ESTRACE) 2 MG tablet Take 2 mg by mouth as needed.   . hydrocortisone (ANUSOL-HC) 2.5 % rectal cream Place 1 application rectally 2 (two) times daily.  Toni Hayes Oil 500 MG CAPS Take 500 mg by mouth daily.  . metoprolol succinate (TOPROL-XL) 25 MG 24 hr tablet TAKE 1 TABLET BY  MOUTH EVERY DAY  . Multiple Vitamin (MULTIVITAMIN) tablet Take 1 tablet by mouth daily.  . Probiotic Product (PROBIOTIC DAILY PO) Take by mouth daily.  . risedronate (ACTONEL) 35 MG tablet Take 35 mg by mouth once a week.  Marland Kitchen VITAMIN E PO Take 1 capsule by mouth daily.   No facility-administered encounter medications on file as of 02/27/2021.   Physical exam deferred due to telehealth medicine I spent 50 minutes providing this consultation; time includes spent with patient/family, chart review and documentation. More than 50% of the time in this consultation was spent on coordinating communication   Thank you for the opportunity to participate in the care of Toni Hayes Please call our office at [phone] if we can be of additional assistance.  Note: Portions of this note were generated with Lobbyist. Dictation errors may occur despite best attempts at proofreading.  Teodoro Spray, NP

## 2021-03-21 DIAGNOSIS — R69 Illness, unspecified: Secondary | ICD-10-CM | POA: Diagnosis not present

## 2021-03-21 DIAGNOSIS — I482 Chronic atrial fibrillation, unspecified: Secondary | ICD-10-CM | POA: Diagnosis not present

## 2021-03-21 DIAGNOSIS — I10 Essential (primary) hypertension: Secondary | ICD-10-CM | POA: Diagnosis not present

## 2021-03-21 DIAGNOSIS — R002 Palpitations: Secondary | ICD-10-CM | POA: Diagnosis not present

## 2021-03-22 ENCOUNTER — Telehealth: Payer: Self-pay

## 2021-03-22 NOTE — Telephone Encounter (Signed)
03/22/21 @430pm : patients daughter, Wilfred Curtis, shared that patient will be  transitioning to [facility] memory care [address], [place], [place] [address] on Monday 03/27/21.  She stated due to the move, they will be ending services with [facility].   Palliative care admin team made aware.

## 2021-03-29 DIAGNOSIS — R69 Illness, unspecified: Secondary | ICD-10-CM | POA: Diagnosis not present

## 2021-03-29 DIAGNOSIS — I4811 Longstanding persistent atrial fibrillation: Secondary | ICD-10-CM | POA: Diagnosis not present

## 2021-03-29 DIAGNOSIS — I10 Essential (primary) hypertension: Secondary | ICD-10-CM | POA: Diagnosis not present

## 2021-03-29 DIAGNOSIS — K5900 Constipation, unspecified: Secondary | ICD-10-CM | POA: Diagnosis not present

## 2021-04-05 DIAGNOSIS — B372 Candidiasis of skin and nail: Secondary | ICD-10-CM | POA: Diagnosis not present

## 2021-04-05 DIAGNOSIS — R69 Illness, unspecified: Secondary | ICD-10-CM | POA: Diagnosis not present

## 2021-04-08 DIAGNOSIS — R296 Repeated falls: Secondary | ICD-10-CM | POA: Diagnosis not present

## 2021-04-09 DIAGNOSIS — S0033XA Contusion of nose, initial encounter: Secondary | ICD-10-CM | POA: Diagnosis not present

## 2021-04-09 DIAGNOSIS — R296 Repeated falls: Secondary | ICD-10-CM | POA: Diagnosis not present

## 2021-04-09 DIAGNOSIS — Z043 Encounter for examination and observation following other accident: Secondary | ICD-10-CM | POA: Diagnosis not present

## 2021-04-09 DIAGNOSIS — R69 Illness, unspecified: Secondary | ICD-10-CM | POA: Diagnosis not present

## 2021-04-09 DIAGNOSIS — Z743 Need for continuous supervision: Secondary | ICD-10-CM | POA: Diagnosis not present

## 2021-04-09 DIAGNOSIS — R918 Other nonspecific abnormal finding of lung field: Secondary | ICD-10-CM | POA: Diagnosis not present

## 2021-04-09 DIAGNOSIS — R222 Localized swelling, mass and lump, trunk: Secondary | ICD-10-CM | POA: Diagnosis not present

## 2021-04-09 DIAGNOSIS — K6389 Other specified diseases of intestine: Secondary | ICD-10-CM | POA: Diagnosis not present

## 2021-04-09 DIAGNOSIS — Z8673 Personal history of transient ischemic attack (TIA), and cerebral infarction without residual deficits: Secondary | ICD-10-CM | POA: Diagnosis not present

## 2021-04-09 DIAGNOSIS — J90 Pleural effusion, not elsewhere classified: Secondary | ICD-10-CM | POA: Diagnosis not present

## 2021-04-09 DIAGNOSIS — Z9181 History of falling: Secondary | ICD-10-CM | POA: Diagnosis not present

## 2021-04-09 DIAGNOSIS — R531 Weakness: Secondary | ICD-10-CM | POA: Diagnosis not present

## 2021-04-09 DIAGNOSIS — S0121XA Laceration without foreign body of nose, initial encounter: Secondary | ICD-10-CM | POA: Diagnosis not present

## 2021-04-09 DIAGNOSIS — E278 Other specified disorders of adrenal gland: Secondary | ICD-10-CM | POA: Diagnosis not present

## 2021-04-09 DIAGNOSIS — R4182 Altered mental status, unspecified: Secondary | ICD-10-CM | POA: Diagnosis not present

## 2021-04-09 DIAGNOSIS — I10 Essential (primary) hypertension: Secondary | ICD-10-CM | POA: Diagnosis not present

## 2021-04-09 DIAGNOSIS — Z20822 Contact with and (suspected) exposure to covid-19: Secondary | ICD-10-CM | POA: Diagnosis not present

## 2021-04-09 DIAGNOSIS — I2510 Atherosclerotic heart disease of native coronary artery without angina pectoris: Secondary | ICD-10-CM | POA: Diagnosis not present

## 2021-04-09 DIAGNOSIS — K5900 Constipation, unspecified: Secondary | ICD-10-CM | POA: Diagnosis not present

## 2021-04-09 DIAGNOSIS — K449 Diaphragmatic hernia without obstruction or gangrene: Secondary | ICD-10-CM | POA: Diagnosis not present

## 2021-04-09 DIAGNOSIS — J984 Other disorders of lung: Secondary | ICD-10-CM | POA: Diagnosis not present

## 2021-04-09 DIAGNOSIS — K8689 Other specified diseases of pancreas: Secondary | ICD-10-CM | POA: Diagnosis not present

## 2021-04-09 DIAGNOSIS — Z79899 Other long term (current) drug therapy: Secondary | ICD-10-CM | POA: Diagnosis not present

## 2021-04-09 DIAGNOSIS — R404 Transient alteration of awareness: Secondary | ICD-10-CM | POA: Diagnosis not present

## 2021-04-09 DIAGNOSIS — R0902 Hypoxemia: Secondary | ICD-10-CM | POA: Diagnosis not present

## 2021-04-09 DIAGNOSIS — R279 Unspecified lack of coordination: Secondary | ICD-10-CM | POA: Diagnosis not present

## 2021-04-09 DIAGNOSIS — R000 Tachycardia, unspecified: Secondary | ICD-10-CM | POA: Diagnosis not present

## 2021-04-09 DIAGNOSIS — W19XXXA Unspecified fall, initial encounter: Secondary | ICD-10-CM | POA: Diagnosis not present

## 2021-04-09 DIAGNOSIS — S0990XA Unspecified injury of head, initial encounter: Secondary | ICD-10-CM | POA: Diagnosis not present

## 2021-04-09 DIAGNOSIS — Z885 Allergy status to narcotic agent status: Secondary | ICD-10-CM | POA: Diagnosis not present

## 2021-04-09 DIAGNOSIS — I4891 Unspecified atrial fibrillation: Secondary | ICD-10-CM | POA: Diagnosis not present

## 2021-04-10 DIAGNOSIS — Z8673 Personal history of transient ischemic attack (TIA), and cerebral infarction without residual deficits: Secondary | ICD-10-CM | POA: Diagnosis not present

## 2021-04-10 DIAGNOSIS — I10 Essential (primary) hypertension: Secondary | ICD-10-CM | POA: Diagnosis not present

## 2021-04-10 DIAGNOSIS — S0990XA Unspecified injury of head, initial encounter: Secondary | ICD-10-CM | POA: Diagnosis not present

## 2021-04-10 DIAGNOSIS — I4891 Unspecified atrial fibrillation: Secondary | ICD-10-CM | POA: Diagnosis not present

## 2021-04-10 DIAGNOSIS — R69 Illness, unspecified: Secondary | ICD-10-CM | POA: Diagnosis not present

## 2021-04-10 DIAGNOSIS — W19XXXD Unspecified fall, subsequent encounter: Secondary | ICD-10-CM | POA: Diagnosis not present

## 2021-04-11 DIAGNOSIS — R296 Repeated falls: Secondary | ICD-10-CM | POA: Diagnosis not present

## 2021-04-11 DIAGNOSIS — R69 Illness, unspecified: Secondary | ICD-10-CM | POA: Diagnosis not present

## 2021-04-17 DIAGNOSIS — R296 Repeated falls: Secondary | ICD-10-CM | POA: Diagnosis not present

## 2021-04-17 DIAGNOSIS — I4811 Longstanding persistent atrial fibrillation: Secondary | ICD-10-CM | POA: Diagnosis not present

## 2021-04-17 DIAGNOSIS — R69 Illness, unspecified: Secondary | ICD-10-CM | POA: Diagnosis not present

## 2021-04-23 DIAGNOSIS — U071 COVID-19: Secondary | ICD-10-CM | POA: Diagnosis not present

## 2021-04-26 DIAGNOSIS — U071 COVID-19: Secondary | ICD-10-CM | POA: Diagnosis not present

## 2021-04-26 DIAGNOSIS — R69 Illness, unspecified: Secondary | ICD-10-CM | POA: Diagnosis not present

## 2021-05-01 DIAGNOSIS — I4811 Longstanding persistent atrial fibrillation: Secondary | ICD-10-CM | POA: Diagnosis not present

## 2021-05-01 DIAGNOSIS — K5900 Constipation, unspecified: Secondary | ICD-10-CM | POA: Diagnosis not present

## 2021-05-01 DIAGNOSIS — I10 Essential (primary) hypertension: Secondary | ICD-10-CM | POA: Diagnosis not present

## 2021-05-01 DIAGNOSIS — U071 COVID-19: Secondary | ICD-10-CM | POA: Diagnosis not present

## 2021-06-05 DIAGNOSIS — I10 Essential (primary) hypertension: Secondary | ICD-10-CM | POA: Diagnosis not present

## 2021-06-05 DIAGNOSIS — R296 Repeated falls: Secondary | ICD-10-CM | POA: Diagnosis not present

## 2021-06-05 DIAGNOSIS — K5900 Constipation, unspecified: Secondary | ICD-10-CM | POA: Diagnosis not present

## 2021-06-05 DIAGNOSIS — I4811 Longstanding persistent atrial fibrillation: Secondary | ICD-10-CM | POA: Diagnosis not present

## 2021-07-05 DIAGNOSIS — I10 Essential (primary) hypertension: Secondary | ICD-10-CM | POA: Diagnosis not present

## 2021-07-05 DIAGNOSIS — K5900 Constipation, unspecified: Secondary | ICD-10-CM | POA: Diagnosis not present

## 2021-07-05 DIAGNOSIS — R296 Repeated falls: Secondary | ICD-10-CM | POA: Diagnosis not present

## 2021-07-05 DIAGNOSIS — I4811 Longstanding persistent atrial fibrillation: Secondary | ICD-10-CM | POA: Diagnosis not present

## 2021-07-17 DIAGNOSIS — E7849 Other hyperlipidemia: Secondary | ICD-10-CM | POA: Diagnosis not present

## 2021-07-17 DIAGNOSIS — E038 Other specified hypothyroidism: Secondary | ICD-10-CM | POA: Diagnosis not present

## 2021-07-17 DIAGNOSIS — Z79899 Other long term (current) drug therapy: Secondary | ICD-10-CM | POA: Diagnosis not present

## 2021-07-17 DIAGNOSIS — E119 Type 2 diabetes mellitus without complications: Secondary | ICD-10-CM | POA: Diagnosis not present

## 2021-07-17 DIAGNOSIS — E559 Vitamin D deficiency, unspecified: Secondary | ICD-10-CM | POA: Diagnosis not present

## 2021-07-17 DIAGNOSIS — D518 Other vitamin B12 deficiency anemias: Secondary | ICD-10-CM | POA: Diagnosis not present

## 2021-08-09 DIAGNOSIS — I10 Essential (primary) hypertension: Secondary | ICD-10-CM | POA: Diagnosis not present

## 2021-08-09 DIAGNOSIS — R69 Illness, unspecified: Secondary | ICD-10-CM | POA: Diagnosis not present

## 2021-08-09 DIAGNOSIS — K5900 Constipation, unspecified: Secondary | ICD-10-CM | POA: Diagnosis not present

## 2021-08-09 DIAGNOSIS — I4811 Longstanding persistent atrial fibrillation: Secondary | ICD-10-CM | POA: Diagnosis not present

## 2021-08-24 DIAGNOSIS — R296 Repeated falls: Secondary | ICD-10-CM | POA: Diagnosis not present

## 2021-08-24 DIAGNOSIS — I4811 Longstanding persistent atrial fibrillation: Secondary | ICD-10-CM | POA: Diagnosis not present

## 2021-08-24 DIAGNOSIS — I10 Essential (primary) hypertension: Secondary | ICD-10-CM | POA: Diagnosis not present

## 2021-09-06 DIAGNOSIS — I4811 Longstanding persistent atrial fibrillation: Secondary | ICD-10-CM | POA: Diagnosis not present

## 2021-09-06 DIAGNOSIS — I10 Essential (primary) hypertension: Secondary | ICD-10-CM | POA: Diagnosis not present

## 2021-09-06 DIAGNOSIS — R296 Repeated falls: Secondary | ICD-10-CM | POA: Diagnosis not present

## 2021-09-06 DIAGNOSIS — K5900 Constipation, unspecified: Secondary | ICD-10-CM | POA: Diagnosis not present

## 2021-10-04 DIAGNOSIS — I4811 Longstanding persistent atrial fibrillation: Secondary | ICD-10-CM | POA: Diagnosis not present

## 2021-10-04 DIAGNOSIS — K5900 Constipation, unspecified: Secondary | ICD-10-CM | POA: Diagnosis not present

## 2021-10-04 DIAGNOSIS — I10 Essential (primary) hypertension: Secondary | ICD-10-CM | POA: Diagnosis not present

## 2021-10-23 DIAGNOSIS — D518 Other vitamin B12 deficiency anemias: Secondary | ICD-10-CM | POA: Diagnosis not present

## 2021-10-23 DIAGNOSIS — E119 Type 2 diabetes mellitus without complications: Secondary | ICD-10-CM | POA: Diagnosis not present

## 2021-10-23 DIAGNOSIS — E7849 Other hyperlipidemia: Secondary | ICD-10-CM | POA: Diagnosis not present

## 2021-10-23 DIAGNOSIS — Z79899 Other long term (current) drug therapy: Secondary | ICD-10-CM | POA: Diagnosis not present

## 2021-10-23 DIAGNOSIS — E559 Vitamin D deficiency, unspecified: Secondary | ICD-10-CM | POA: Diagnosis not present

## 2021-10-23 DIAGNOSIS — E038 Other specified hypothyroidism: Secondary | ICD-10-CM | POA: Diagnosis not present

## 2021-11-29 DIAGNOSIS — I10 Essential (primary) hypertension: Secondary | ICD-10-CM | POA: Diagnosis not present

## 2021-11-29 DIAGNOSIS — I4811 Longstanding persistent atrial fibrillation: Secondary | ICD-10-CM | POA: Diagnosis not present

## 2021-11-29 DIAGNOSIS — U071 COVID-19: Secondary | ICD-10-CM | POA: Diagnosis not present

## 2021-11-29 DIAGNOSIS — R296 Repeated falls: Secondary | ICD-10-CM | POA: Diagnosis not present

## 2021-12-27 DIAGNOSIS — R69 Illness, unspecified: Secondary | ICD-10-CM | POA: Diagnosis not present

## 2021-12-27 DIAGNOSIS — R296 Repeated falls: Secondary | ICD-10-CM | POA: Diagnosis not present

## 2021-12-27 DIAGNOSIS — I4811 Longstanding persistent atrial fibrillation: Secondary | ICD-10-CM | POA: Diagnosis not present

## 2022-01-24 DIAGNOSIS — I4811 Longstanding persistent atrial fibrillation: Secondary | ICD-10-CM | POA: Diagnosis not present

## 2022-01-24 DIAGNOSIS — R69 Illness, unspecified: Secondary | ICD-10-CM | POA: Diagnosis not present

## 2022-01-24 DIAGNOSIS — I10 Essential (primary) hypertension: Secondary | ICD-10-CM | POA: Diagnosis not present

## 2022-01-24 DIAGNOSIS — R296 Repeated falls: Secondary | ICD-10-CM | POA: Diagnosis not present

## 2022-02-21 DIAGNOSIS — I10 Essential (primary) hypertension: Secondary | ICD-10-CM | POA: Diagnosis not present

## 2022-02-21 DIAGNOSIS — R296 Repeated falls: Secondary | ICD-10-CM | POA: Diagnosis not present

## 2022-02-21 DIAGNOSIS — R69 Illness, unspecified: Secondary | ICD-10-CM | POA: Diagnosis not present

## 2022-02-21 DIAGNOSIS — I4811 Longstanding persistent atrial fibrillation: Secondary | ICD-10-CM | POA: Diagnosis not present

## 2022-03-21 DIAGNOSIS — I4811 Longstanding persistent atrial fibrillation: Secondary | ICD-10-CM | POA: Diagnosis not present

## 2022-03-21 DIAGNOSIS — R296 Repeated falls: Secondary | ICD-10-CM | POA: Diagnosis not present

## 2022-03-21 DIAGNOSIS — R69 Illness, unspecified: Secondary | ICD-10-CM | POA: Diagnosis not present

## 2022-03-21 DIAGNOSIS — I10 Essential (primary) hypertension: Secondary | ICD-10-CM | POA: Diagnosis not present

## 2022-04-19 DIAGNOSIS — I10 Essential (primary) hypertension: Secondary | ICD-10-CM | POA: Diagnosis not present

## 2022-04-19 DIAGNOSIS — R69 Illness, unspecified: Secondary | ICD-10-CM | POA: Diagnosis not present

## 2022-05-01 DIAGNOSIS — R69 Illness, unspecified: Secondary | ICD-10-CM | POA: Diagnosis not present

## 2022-05-01 DIAGNOSIS — I10 Essential (primary) hypertension: Secondary | ICD-10-CM | POA: Diagnosis not present

## 2022-05-01 DIAGNOSIS — Z0000 Encounter for general adult medical examination without abnormal findings: Secondary | ICD-10-CM | POA: Diagnosis not present

## 2022-05-01 DIAGNOSIS — I4811 Longstanding persistent atrial fibrillation: Secondary | ICD-10-CM | POA: Diagnosis not present

## 2022-05-01 DIAGNOSIS — R296 Repeated falls: Secondary | ICD-10-CM | POA: Diagnosis not present

## 2022-06-06 DIAGNOSIS — Z79899 Other long term (current) drug therapy: Secondary | ICD-10-CM | POA: Diagnosis not present

## 2022-07-05 DIAGNOSIS — I10 Essential (primary) hypertension: Secondary | ICD-10-CM | POA: Diagnosis not present

## 2022-07-05 DIAGNOSIS — R69 Illness, unspecified: Secondary | ICD-10-CM | POA: Diagnosis not present

## 2022-07-05 DIAGNOSIS — I4811 Longstanding persistent atrial fibrillation: Secondary | ICD-10-CM | POA: Diagnosis not present

## 2022-09-05 DIAGNOSIS — Z79899 Other long term (current) drug therapy: Secondary | ICD-10-CM | POA: Diagnosis not present

## 2023-05-28 DIAGNOSIS — K5900 Constipation, unspecified: Secondary | ICD-10-CM | POA: Diagnosis not present

## 2023-05-28 DIAGNOSIS — I10 Essential (primary) hypertension: Secondary | ICD-10-CM | POA: Diagnosis not present

## 2023-05-28 DIAGNOSIS — I482 Chronic atrial fibrillation, unspecified: Secondary | ICD-10-CM | POA: Diagnosis not present

## 2023-05-28 DIAGNOSIS — F01518 Vascular dementia, unspecified severity, with other behavioral disturbance: Secondary | ICD-10-CM | POA: Diagnosis not present

## 2023-05-28 DIAGNOSIS — I6931 Attention and concentration deficit following cerebral infarction: Secondary | ICD-10-CM | POA: Diagnosis not present

## 2024-12-27 DEATH — deceased
